# Patient Record
Sex: Female | Born: 1980 | Race: White | Hispanic: No | Marital: Single | Smoking: Current every day smoker
Health system: Southern US, Community
[De-identification: ages and names within clinical notes are randomized; demographics above are authoritative.]

## PROBLEM LIST (undated history)

## (undated) DIAGNOSIS — J45909 Unspecified asthma, uncomplicated: Secondary | ICD-10-CM

## (undated) HISTORY — PX: TONSILLECTOMY: SUR1361

## (undated) HISTORY — PX: TUBAL LIGATION: SHX77

## (undated) HISTORY — DX: Unspecified asthma, uncomplicated: J45.909

---

## 2004-09-08 ENCOUNTER — Inpatient Hospital Stay: Payer: Self-pay

## 2005-12-07 ENCOUNTER — Observation Stay: Payer: Self-pay

## 2006-01-04 ENCOUNTER — Inpatient Hospital Stay: Payer: Self-pay | Admitting: Obstetrics & Gynecology

## 2007-05-11 ENCOUNTER — Emergency Department: Payer: Self-pay | Admitting: Emergency Medicine

## 2013-12-02 ENCOUNTER — Emergency Department: Payer: Self-pay | Admitting: Emergency Medicine

## 2015-09-20 ENCOUNTER — Encounter: Payer: Self-pay | Admitting: Emergency Medicine

## 2015-09-20 ENCOUNTER — Emergency Department
Admission: EM | Admit: 2015-09-20 | Discharge: 2015-09-20 | Disposition: A | Discharge status: Home / Self Care | Payer: Self-pay | Attending: Emergency Medicine | Admitting: Emergency Medicine

## 2015-09-20 DIAGNOSIS — Y9289 Other specified places as the place of occurrence of the external cause: Secondary | ICD-10-CM | POA: Insufficient documentation

## 2015-09-20 DIAGNOSIS — Y998 Other external cause status: Secondary | ICD-10-CM | POA: Insufficient documentation

## 2015-09-20 DIAGNOSIS — S91312A Laceration without foreign body, left foot, initial encounter: Secondary | ICD-10-CM | POA: Insufficient documentation

## 2015-09-20 DIAGNOSIS — Z720 Tobacco use: Secondary | ICD-10-CM | POA: Insufficient documentation

## 2015-09-20 DIAGNOSIS — Z880 Allergy status to penicillin: Secondary | ICD-10-CM | POA: Insufficient documentation

## 2015-09-20 DIAGNOSIS — Y9389 Activity, other specified: Secondary | ICD-10-CM | POA: Insufficient documentation

## 2015-09-20 DIAGNOSIS — W25XXXA Contact with sharp glass, initial encounter: Secondary | ICD-10-CM | POA: Insufficient documentation

## 2015-09-20 MED ORDER — BACITRACIN ZINC 500 UNIT/GM EX OINT
TOPICAL_OINTMENT | CUTANEOUS | Status: AC
  Administered 2015-09-20: 1 via TOPICAL
  Filled 2015-09-20: qty 0.9

## 2015-09-20 MED ORDER — LIDOCAINE-EPINEPHRINE (PF) 2 %-1:200000 IJ SOLN
20.0000 mL | Freq: Once | INTRAMUSCULAR | Status: AC
  Administered 2015-09-20: 20 mL
  Filled 2015-09-20: qty 20

## 2015-09-20 MED ORDER — BACITRACIN ZINC 500 UNIT/GM EX OINT
TOPICAL_OINTMENT | Freq: Two times a day (BID) | CUTANEOUS | Status: DC
  Administered 2015-09-20: 1 via TOPICAL

## 2015-09-20 MED ORDER — LIDOCAINE-EPINEPHRINE (PF) 1 %-1:200000 IJ SOLN
INTRAMUSCULAR | Status: AC
  Filled 2015-09-20: qty 30

## 2015-09-20 NOTE — ED Notes (Signed)
Pt to ed with c/o laceration to bottom of left foot.  Laceration approx 3 inches, mild bleeding at this time, bandage applied. Pt tolerated well.

## 2015-09-20 NOTE — ED Provider Notes (Signed)
[facility] Emergency Department Provider Note  ____________________________________________  Time seen: Approximately 2:51 PM  I have reviewed the triage vital signs and the nursing notes.   HISTORY  Chief Complaint Laceration    HPI Kelly Steele is a 34 y.o. female who presents emergency department complaining of laceration to the bottom of her left foot. She states that she stepped on some broken glass approximately 10 hours prior to arrival. She states that he was controlled. She is not bleeding there was anything embedded in the wound. She reports a sharp stinging sensation over laceration. No numbness or tingling.   History reviewed. No pertinent past medical history.  There are no active problems to display for this patient.   History reviewed. No pertinent past surgical history.  No current outpatient prescriptions on file.  Allergies Penicillins  History reviewed. No pertinent family history.  Social History Social History  Substance Use Topics  . Smoking status: Current Every Day Smoker  . Smokeless tobacco: None  . Alcohol Use: Yes    Review of Systems Constitutional: No fever/chills Eyes: No visual changes. ENT: No sore throat. Cardiovascular: Denies chest pain. Respiratory: Denies shortness of breath. Gastrointestinal: No abdominal pain.  No nausea, no vomiting.  No diarrhea.  No constipation. Genitourinary: Negative for dysuria. Musculoskeletal: Negative for back pain. Skin: Negative for rash. Endorses laceration to the bottom of her left foot. Neurological: Negative for headaches, focal weakness or numbness.  10-point ROS otherwise negative.  ____________________________________________   PHYSICAL EXAM:  VITAL SIGNS: ED Triage Vitals  Enc Vitals Group     BP 09/20/15 1426 132/92 mmHg     Pulse Rate 09/20/15 1426 86     Resp 09/20/15 1426 20     Temp 09/20/15 1426 98.2 F (36.8 C)     Temp Source 09/20/15 1426  Oral     SpO2 09/20/15 1426 100 %     Weight 09/20/15 1426 250 lb (113.399 kg)     Height 09/20/15 1426 5\' 6"  (1.676 m)     Head Cir --      Peak Flow --      Pain Score 09/20/15 1427 8     Pain Loc --      Pain Edu? --      Excl. in GC? --     Constitutional: Alert and oriented. Well appearing and in no acute distress. Eyes: Conjunctivae are normal. PERRL. EOMI. Head: Atraumatic. Nose: No congestion/rhinnorhea. Mouth/Throat: Mucous membranes are moist.  Oropharynx non-erythematous. Neck: No stridor.   Cardiovascular: Normal rate, regular rhythm. Grossly normal heart sounds.  Good peripheral circulation. Respiratory: Normal respiratory effort.  No retractions. Lungs CTAB. Gastrointestinal: Soft and nontender. No distention. No abdominal bruits. No CVA tenderness. Musculoskeletal: No lower extremity tenderness nor edema.  No joint effusions. Neurologic:  Normal speech and language. No gross focal neurologic deficits are appreciated. No gait instability. Skin:  Skin is warm, dry. No rash noted. Laceration noted to the distal plantar region of left foot. Laceration is approximately 8 cm in length. It runs from lateral aspect of the ball of her foot through the proximal first digit. Bleeding is controlled. No visible foreign body. Full range of motion of toe. No visible tendon or muscle damage. Psychiatric: Mood and affect are normal. Speech and behavior are normal.  ____________________________________________   LABS (all labs ordered are listed, but only abnormal results are displayed)  Labs Reviewed - No data to display ____________________________________________  EKG   ____________________________________________  RADIOLOGY   ____________________________________________   PROCEDURES  Procedure(s) performed: yes, laceration repair, see procedure note(s).   LACERATION REPAIR Performed by: Racheal Patches Authorized by: Delorise Royals Cuthriell Consent: Verbal  consent obtained. Risks and benefits: risks, benefits and alternatives were discussed Consent given by: patient Patient identity confirmed: provided demographic data Prepped and Draped in normal sterile fashion Wound explored  Laceration Location: The plantar left foot  Laceration Length: 8 cm  No Foreign Bodies seen or palpated  Anesthesia: local infiltration  Local anesthetic: lidocaine 1 % with epinephrine  Anesthetic total: 15 ml  Irrigation method: syringe Amount of cleaning: standard  Skin closure: 4-0 Ethilon suture   Number of sutures: 9  Technique: Simple interrupted   Patient tolerance: Patient tolerated the procedure well with no immediate complications.   Critical Care performed: No  ____________________________________________   INITIAL IMPRESSION / ASSESSMENT AND PLAN / ED COURSE  Pertinent labs & imaging results that were available during my care of the patient were reviewed by me and considered in my medical decision making (see chart for details).  The patient is a 34 year old female who presents the emergency department with a laceration to the bottom of her left foot. Laceration was cleansed using Betadine and water. Area was then infiltrated with local anesthetic and wound was explored. No foreign body was visible. Laceration was closed using simple interrupted sutures. 9 sutures placed. Patient tolerated procedures well. Gave patient is structures to return in 7-10 days for suture removal. Patient verbalizes understanding of same. Patient to return to the emergency department for any signs of infection. ____________________________________________   FINAL CLINICAL IMPRESSION(S) / ED DIAGNOSES  Final diagnoses:  Foot laceration, left, initial encounter      Racheal Patches, PA-C 09/20/15 1635  Darien Ramus, MD 09/20/15 2046

## 2015-09-20 NOTE — Discharge Instructions (Signed)
Laceration Care, Adult °A laceration is a cut that goes through all of the layers of the skin and into the tissue that is right under the skin. Some lacerations heal on their own. Others need to be closed with stitches (sutures), staples, skin adhesive strips, or skin glue. Proper laceration care minimizes the risk of infection and helps the laceration to heal better. °HOW TO CARE FOR YOUR LACERATION °If sutures or staples were used: °· Keep the wound clean and dry. °· If you were given a bandage (dressing), you should change it at least one time per day or as told by your health care provider. You should also change it if it becomes wet or dirty. °· Keep the wound completely dry for the first 24 hours or as told by your health care provider. After that time, you may shower or bathe. However, make sure that the wound is not soaked in water until after the sutures or staples have been removed. °· Clean the wound one time each day or as told by your health care provider: °· Wash the wound with soap and water. °· Rinse the wound with water to remove all soap. °· Pat the wound dry with a clean towel. Do not rub the wound. °· After cleaning the wound, apply a thin layer of antibiotic ointment as told by your health care provider. This will help to prevent infection and keep the dressing from sticking to the wound. °· Have the sutures or staples removed as told by your health care provider. °If skin adhesive strips were used: °· Keep the wound clean and dry. °· If you were given a bandage (dressing), you should change it at least one time per day or as told by your health care provider. You should also change it if it becomes dirty or wet. °· Do not get the skin adhesive strips wet. You may shower or bathe, but be careful to keep the wound dry. °· If the wound gets wet, pat it dry with a clean towel. Do not rub the wound. °· Skin adhesive strips fall off on their own. You may trim the strips as the wound heals. Do not  remove skin adhesive strips that are still stuck to the wound. They will fall off in time. °If skin glue was used: °· Try to keep the wound dry, but you may briefly wet it in the shower or bath. Do not soak the wound in water, such as by swimming. °· After you have showered or bathed, gently pat the wound dry with a clean towel. Do not rub the wound. °· Do not do any activities that will make you sweat heavily until the skin glue has fallen off on its own. °· Do not apply liquid, cream, or ointment medicine to the wound while the skin glue is in place. Using those may loosen the film before the wound has healed. °· If you were given a bandage (dressing), you should change it at least one time per day or as told by your health care provider. You should also change it if it becomes dirty or wet. °· If a dressing is placed over the wound, be careful not to apply tape directly over the skin glue. Doing that may cause the glue to be pulled off before the wound has healed. °· Do not pick at the glue. The skin glue usually remains in place for 5-10 days, then it falls off of the skin. °General Instructions °· Take over-the-counter and prescription   medicines only as told by your health care provider. °· If you were prescribed an antibiotic medicine or ointment, take or apply it as told by your doctor. Do not stop using it even if your condition improves. °· To help prevent scarring, make sure to cover your wound with sunscreen whenever you are outside after stitches are removed, after adhesive strips are removed, or when glue remains in place and the wound is healed. Make sure to wear a sunscreen of at least 30 SPF. °· Do not scratch or pick at the wound. °· Keep all follow-up visits as told by your health care provider. This is important. °· Check your wound every day for signs of infection. Watch for: °· Redness, swelling, or pain. °· Fluid, blood, or pus. °· Raise (elevate) the injured area above the level of your heart  while you are sitting or lying down, if possible. °SEEK MEDICAL CARE IF: °· You received a tetanus shot and you have swelling, severe pain, redness, or bleeding at the injection site. °· You have a fever. °· A wound that was closed breaks open. °· You notice a bad smell coming from your wound or your dressing. °· You notice something coming out of the wound, such as wood or glass. °· Your pain is not controlled with medicine. °· You have increased redness, swelling, or pain at the site of your wound. °· You have fluid, blood, or pus coming from your wound. °· You notice a change in the color of your skin near your wound. °· You need to change the dressing frequently due to fluid, blood, or pus draining from the wound. °· You develop a new rash. °· You develop numbness around the wound. °SEEK IMMEDIATE MEDICAL CARE IF: °· You develop severe swelling around the wound. °· Your pain suddenly increases and is severe. °· You develop painful lumps near the wound or on skin that is anywhere on your body. °· You have a red streak going away from your wound. °· The wound is on your hand or foot and you cannot properly move a finger or toe. °· The wound is on your hand or foot and you notice that your fingers or toes look pale or bluish. °  °This information is not intended to replace advice given to you by your health care provider. Make sure you discuss any questions you have with your health care provider. °  °Document Released: 11/22/2005 Document Revised: 04/08/2015 Document Reviewed: 11/18/2014 °Elsevier Interactive Patient Education ©2016 [employer] ° °Stitches, Staples, or Adhesive Wound Closure °Health care providers use stitches (sutures), staples, and certain glue (skin adhesives) to hold skin together while it heals (wound closure). You may need this treatment after you have surgery or if you cut your skin accidentally. These methods help your skin to heal more quickly and make it less likely that you will have  a scar. A wound may take several months to heal completely. °The type of wound you have determines when your wound gets closed. In most cases, the wound is closed as soon as possible (primary skin closure). Sometimes, closure is delayed so the wound can be cleaned and allowed to heal naturally. This reduces the chance of infection. Delayed closure may be needed if your wound: °· Is caused by a bite. °· Happened more than 6 hours ago. °· Involves loss of skin or the tissues under the skin. °· Has dirt or debris in it that cannot be removed. °· Is infected. °WHAT   ARE THE DIFFERENT KINDS OF WOUND CLOSURES? °There are many options for wound closure. The one that your health care provider uses depends on how deep and how large your wound is. °Adhesive Glue °To use this type of glue to close a wound, your health care provider holds the edges of the wound together and paints the glue on the surface of your skin. You may need more than one layer of glue. Then the wound may be covered with a light bandage (dressing). °This type of skin closure may be used for small wounds that are not deep (superficial). Using glue for wound closure is less painful than other methods. It does not require a medicine that numbs the area (local anesthetic). This method also leaves nothing to be removed. Adhesive glue is often used for children and on facial wounds. °Adhesive glue cannot be used for wounds that are deep, uneven, or bleeding. It is not used inside of a wound.  °Adhesive Strips °These strips are made of sticky (adhesive), porous paper. They are applied across your skin edges like a regular adhesive bandage. You leave them on until they fall off. °Adhesive strips may be used to close very superficial wounds. They may also be used along with sutures to improve the closure of your skin edges.  °Sutures °Sutures are the oldest method of wound closure. Sutures can be made from natural substances, such as silk, or from synthetic  materials, such as nylon and steel. They can be made from a material that your body can break down as your wound heals (absorbable), or they can be made from a material that needs to be removed from your skin (nonabsorbable). They come in many different strengths and sizes. °Your health care provider attaches the sutures to a steel needle on one end. Sutures can be passed through your skin, or through the tissues beneath your skin. Then they are tied and cut. Your skin edges may be closed in one continuous stitch or in separate stitches. °Sutures are strong and can be used for all kinds of wounds. Absorbable sutures may be used to close tissues under the skin. The disadvantage of sutures is that they may cause skin reactions that lead to infection. Nonabsorbable sutures need to be removed. °Staples °When surgical staples are used to close a wound, the edges of your skin on both sides of the wound are brought close together. A staple is placed across the wound, and an instrument secures the edges together. Staples are often used to close surgical cuts (incisions). °Staples are faster to use than sutures, and they cause less skin reaction. Staples need to be removed using a tool that bends the staples away from your skin. °HOW DO I CARE FOR MY WOUND CLOSURE? °· Take medicines only as directed by your health care provider. °· If you were prescribed an antibiotic medicine for your wound, finish it all even if you start to feel better. °· Use ointments or creams only as directed by your health care provider. °· Wash your hands with soap and water before and after touching your wound. °· Do not soak your wound in water. Do not take baths, swim, or use a hot tub until your health care provider approves. °· Ask your health care provider when you can start showering. Cover your wound if directed by your health care provider. °· Do not take out your own sutures or staples. °· Do not pick at your wound. Picking can cause an  infection. °·   Keep all follow-up visits as directed by your health care provider. This is important. °HOW LONG WILL I HAVE MY WOUND CLOSURE? °· Leave adhesive glue on your skin until the glue peels away. °· Leave adhesive strips on your skin until the strips fall off. °· Absorbable sutures will dissolve within several days. °· Nonabsorbable sutures and staples must be removed. The location of the wound will determine how long they stay in. This can range from several days to a couple of weeks. °WHEN SHOULD I SEEK HELP FOR MY WOUND CLOSURE? °Contact your health care provider if: °· You have a fever. °· You have chills. °· You have drainage, redness, swelling, or pain at your wound. °· There is a bad smell coming from your wound. °· The skin edges of your wound start to separate after your sutures have been removed. °· Your wound becomes thick, raised, and darker in color after your sutures come out (scarring). °  °This information is not intended to replace advice given to you by your health care provider. Make sure you discuss any questions you have with your health care provider. °  °Document Released: 08/17/2001 Document Revised: 12/13/2014 Document Reviewed: 05/01/2014 °Elsevier Interactive Patient Education ©2016 [employer] ° °

## 2015-09-20 NOTE — ED Notes (Signed)
NAD noted at time of D/C. Pt denies questions or concerns. Pt ambulatory to the lobby at this time.  

## 2017-07-18 ENCOUNTER — Encounter (HOSPITAL_COMMUNITY): Payer: Self-pay

## 2017-07-18 ENCOUNTER — Emergency Department (HOSPITAL_COMMUNITY)
Admission: EM | Admit: 2017-07-18 | Discharge: 2017-07-18 | Disposition: A | Discharge status: Home / Self Care | Payer: Self-pay | Attending: Emergency Medicine | Admitting: Emergency Medicine

## 2017-07-18 ENCOUNTER — Emergency Department (HOSPITAL_COMMUNITY): Payer: Self-pay

## 2017-07-18 DIAGNOSIS — N10 Acute tubulo-interstitial nephritis: Secondary | ICD-10-CM | POA: Insufficient documentation

## 2017-07-18 DIAGNOSIS — R109 Unspecified abdominal pain: Secondary | ICD-10-CM

## 2017-07-18 DIAGNOSIS — F17210 Nicotine dependence, cigarettes, uncomplicated: Secondary | ICD-10-CM | POA: Insufficient documentation

## 2017-07-18 DIAGNOSIS — N201 Calculus of ureter: Secondary | ICD-10-CM | POA: Insufficient documentation

## 2017-07-18 DIAGNOSIS — R1012 Left upper quadrant pain: Secondary | ICD-10-CM | POA: Insufficient documentation

## 2017-07-18 LAB — CBC WITH DIFFERENTIAL/PLATELET
Basophils Absolute: 0.1 10*3/uL (ref 0.0–0.1)
Basophils Relative: 0 %
EOS ABS: 0.2 10*3/uL (ref 0.0–0.7)
EOS PCT: 1 %
HCT: 41.6 % (ref 36.0–46.0)
Hemoglobin: 13.6 g/dL (ref 12.0–15.0)
LYMPHS ABS: 3.4 10*3/uL (ref 0.7–4.0)
Lymphocytes Relative: 24 %
MCH: 29.4 pg (ref 26.0–34.0)
MCHC: 32.7 g/dL (ref 30.0–36.0)
MCV: 90 fL (ref 78.0–100.0)
Monocytes Absolute: 1 10*3/uL (ref 0.1–1.0)
Monocytes Relative: 7 %
Neutro Abs: 9.6 10*3/uL — ABNORMAL HIGH (ref 1.7–7.7)
Neutrophils Relative %: 68 %
PLATELETS: 272 10*3/uL (ref 150–400)
RBC: 4.62 MIL/uL (ref 3.87–5.11)
RDW: 12.9 % (ref 11.5–15.5)
WBC: 14.2 10*3/uL — AB (ref 4.0–10.5)

## 2017-07-18 LAB — URINALYSIS, ROUTINE W REFLEX MICROSCOPIC
BILIRUBIN URINE: NEGATIVE
Glucose, UA: NEGATIVE mg/dL
Ketones, ur: NEGATIVE mg/dL
Nitrite: NEGATIVE
Protein, ur: NEGATIVE mg/dL
SPECIFIC GRAVITY, URINE: 1.009 (ref 1.005–1.030)
pH: 6 (ref 5.0–8.0)

## 2017-07-18 LAB — COMPREHENSIVE METABOLIC PANEL
ALT: 42 U/L (ref 14–54)
ANION GAP: 8 (ref 5–15)
AST: 20 U/L (ref 15–41)
Albumin: 3.3 g/dL — ABNORMAL LOW (ref 3.5–5.0)
Alkaline Phosphatase: 56 U/L (ref 38–126)
BUN: 10 mg/dL (ref 6–20)
CHLORIDE: 107 mmol/L (ref 101–111)
CO2: 23 mmol/L (ref 22–32)
Calcium: 8.7 mg/dL — ABNORMAL LOW (ref 8.9–10.3)
Creatinine, Ser: 0.97 mg/dL (ref 0.44–1.00)
GFR calc non Af Amer: >60 mL/min (ref >60)
Glucose, Bld: 98 mg/dL (ref 65–99)
Potassium: 3.5 mmol/L (ref 3.5–5.1)
SODIUM: 138 mmol/L (ref 135–145)
Total Bilirubin: 0.7 mg/dL (ref 0.3–1.2)
Total Protein: 6.7 g/dL (ref 6.5–8.1)

## 2017-07-18 LAB — I-STAT BETA HCG BLOOD, ED (MC, WL, AP ONLY)

## 2017-07-18 LAB — LIPASE, BLOOD: Lipase: 22 U/L (ref 11–51)

## 2017-07-18 MED ORDER — IOPAMIDOL (ISOVUE-300) INJECTION 61%
100.0000 mL | Freq: Once | INTRAVENOUS | Status: AC | PRN
  Administered 2017-07-18: 100 mL via INTRAVENOUS

## 2017-07-18 MED ORDER — FENTANYL CITRATE (PF) 100 MCG/2ML IJ SOLN
50.0000 ug | Freq: Once | INTRAMUSCULAR | Status: AC
  Administered 2017-07-18: 50 ug via INTRAVENOUS
  Filled 2017-07-18: qty 2

## 2017-07-18 MED ORDER — KETOROLAC TROMETHAMINE 30 MG/ML IJ SOLN
30.0000 mg | Freq: Once | INTRAMUSCULAR | Status: AC
  Administered 2017-07-18: 30 mg via INTRAVENOUS
  Filled 2017-07-18: qty 1

## 2017-07-18 MED ORDER — IOPAMIDOL (ISOVUE-300) INJECTION 61%
INTRAVENOUS | Status: AC
  Filled 2017-07-18: qty 30

## 2017-07-18 MED ORDER — CIPROFLOXACIN IN D5W 400 MG/200ML IV SOLN
400.0000 mg | Freq: Once | INTRAVENOUS | Status: AC
  Administered 2017-07-18: 400 mg via INTRAVENOUS
  Filled 2017-07-18: qty 200

## 2017-07-18 MED ORDER — NAPROXEN 500 MG PO TABS: 500.0000 mg | ORAL_TABLET | Freq: Two times a day (BID) | ORAL | 0 refills | Status: DC

## 2017-07-18 MED ORDER — SODIUM CHLORIDE 0.9 % IV BOLUS (SEPSIS)
500.0000 mL | Freq: Once | INTRAVENOUS | Status: AC
  Administered 2017-07-18: 500 mL via INTRAVENOUS

## 2017-07-18 MED ORDER — HYDROCODONE-ACETAMINOPHEN 5-325 MG PO TABS: 1.0000 | ORAL_TABLET | Freq: Four times a day (QID) | ORAL | 0 refills | Status: DC | PRN

## 2017-07-18 MED ORDER — ONDANSETRON HCL 4 MG/2ML IJ SOLN
4.0000 mg | Freq: Once | INTRAMUSCULAR | Status: AC
  Administered 2017-07-18: 4 mg via INTRAVENOUS
  Filled 2017-07-18: qty 2

## 2017-07-18 MED ORDER — SODIUM CHLORIDE 0.9 % IV SOLN
INTRAVENOUS | Status: DC
  Administered 2017-07-18: 17:00:00 via INTRAVENOUS

## 2017-07-18 MED ORDER — ONDANSETRON HCL 4 MG/2ML IJ SOLN
4.0000 mg | Freq: Once | INTRAMUSCULAR | Status: AC
  Administered 2017-07-18: 4 mg via INTRAVENOUS

## 2017-07-18 MED ORDER — CIPROFLOXACIN HCL 500 MG PO TABS
500.0000 mg | ORAL_TABLET | Freq: Two times a day (BID) | ORAL | 0 refills | Status: DC
Start: 2017-07-18 — End: 2018-09-19

## 2017-07-18 MED ORDER — ONDANSETRON HCL 4 MG/2ML IJ SOLN
INTRAMUSCULAR | Status: AC
  Filled 2017-07-18: qty 2

## 2017-07-18 NOTE — ED Provider Notes (Signed)
[facility]-EMERGENCY DEPT Provider Note   CSN: [id number] Arrival date & time: 07/18/17  1543     History   Chief Complaint Chief Complaint  Patient presents with  . Abdominal Pain    HPI Kelly Steele is a 36 y.o. female.  Patient with complaint of left upper quadrant left flank pain radiating to left lower back started yesterday. Associated with nausea but no vomiting no diarrhea. No fever. Did have chills. No dysuria. Patient is currently on her period and is having vaginal bleeding. Pain as severe at times it is been constant since it started.      History reviewed. No pertinent past medical history.  There are no active problems to display for this patient.   Past Surgical History:  Procedure Laterality Date  . TONSILLECTOMY    . TUBAL LIGATION      OB History    Gravida Para Term Preterm AB Living   3       1 2    SAB TAB Ectopic Multiple Live Births                   Home Medications    Prior to Admission medications   Medication Sig Start Date End Date Taking? Authorizing Provider  ciprofloxacin (CIPRO) 500 MG tablet Take 1 tablet (500 mg total) by mouth 2 (two) times daily. 07/18/17   Vanetta Mulders, MD  HYDROcodone-acetaminophen (NORCO/VICODIN) 5-325 MG tablet Take 1-2 tablets by mouth every 6 (six) hours as needed. 07/18/17   Vanetta Mulders, MD  naproxen (NAPROSYN) 500 MG tablet Take 1 tablet (500 mg total) by mouth 2 (two) times daily. 07/18/17   Vanetta Mulders, MD    Family History No family history on file.  Social History Social History  Substance Use Topics  . Smoking status: Current Every Day Smoker    Packs/day: 1.00    Types: Cigarettes  . Smokeless tobacco: Never Used  . Alcohol use Yes     Comment: occ     Allergies   Penicillins   Review of Systems Review of Systems  Constitutional: Positive for chills. Negative for fever.  HENT: Negative for congestion.   Eyes: Negative for visual disturbance.  Respiratory: Negative for  shortness of breath.   Cardiovascular: Negative for chest pain.  Gastrointestinal: Positive for abdominal pain and nausea. Negative for vomiting.  Genitourinary: Positive for flank pain and vaginal bleeding. Negative for dysuria.  Musculoskeletal: Positive for back pain.  Skin: Negative for rash.  Neurological: Negative for headaches.  Hematological: Does not bruise/bleed easily.  Psychiatric/Behavioral: Negative for confusion.     Physical Exam Updated Vital Signs BP (!) 135/97 (BP Location: Right Arm)   Pulse 70   Temp 98.3 F (36.8 C) (Oral)   Resp 17   Ht 1.676 m (5\' 6" )   Wt 136.1 kg (300 lb)   LMP 07/15/2017   SpO2 98%   BMI 48.42 kg/m   Physical Exam  Constitutional: She is oriented to person, place, and time. She appears well-developed and well-nourished. No distress.  HENT:  Head: Normocephalic and atraumatic.  Mouth/Throat: Oropharynx is clear and moist.  Eyes: Pupils are equal, round, and reactive to light. Conjunctivae and EOM are normal.  Neck: Normal range of motion. Neck supple.  Cardiovascular: Normal rate and regular rhythm.   Pulmonary/Chest: Effort normal and breath sounds normal. No respiratory distress.  Abdominal: Soft. Bowel sounds are normal. There is no tenderness.  Musculoskeletal: Normal range of motion.  Neurological: She is alert  and oriented to person, place, and time. No cranial nerve deficit or sensory deficit. She exhibits normal muscle tone. Coordination normal.  Skin: Skin is warm. No rash noted.  Nursing note and vitals reviewed.    ED Treatments / Results  Labs (all labs ordered are listed, but only abnormal results are displayed) Labs Reviewed  URINALYSIS, ROUTINE W REFLEX MICROSCOPIC - Abnormal; Notable for the following:       Result Value   Hgb urine dipstick SMALL (*)    Leukocytes, UA MODERATE (*)    Bacteria, UA RARE (*)    Squamous Epithelial / LPF 0-5 (*)    All other components within normal limits  CBC WITH  DIFFERENTIAL/PLATELET - Abnormal; Notable for the following:    WBC 14.2 (*)    Neutro Abs 9.6 (*)    All other components within normal limits  COMPREHENSIVE METABOLIC PANEL - Abnormal; Notable for the following:    Calcium 8.7 (*)    Albumin 3.3 (*)    All other components within normal limits  URINE CULTURE  LIPASE, BLOOD  I-STAT BETA HCG BLOOD, ED ([facility], [facility], [facility] ONLY)    EKG  EKG Interpretation None       Radiology Ct Abdomen Pelvis W Contrast  Result Date: 07/18/2017 CLINICAL DATA:  Left-sided abdominal pain radiating to the left flank EXAM: CT ABDOMEN AND PELVIS WITH CONTRAST TECHNIQUE: Multidetector CT imaging of the abdomen and pelvis was performed using the standard protocol following bolus administration of intravenous contrast. CONTRAST:  100 mL Isovue-300 intravenous COMPARISON:  None. FINDINGS: Lower chest: Lung bases demonstrate no acute consolidation or pleural effusion. The heart size is within normal limits. Hepatobiliary: No focal liver abnormality is seen. No gallstones, gallbladder wall thickening, or biliary dilatation. Pancreas: Unremarkable. No pancreatic ductal dilatation or surrounding inflammatory changes. Spleen: Normal in size without focal abnormality. Adrenals/Urinary Tract: Adrenal glands are within normal limits. Mild left perinephric fat stranding with edema around the left renal pelvis. M mildly enlarged left renal pelvis with urothelial enhancement of the renal pelvis an ureter. Questionable punctate, sand like density within the mid left ureter. Slightly thick-walled appearance of the urinary bladder. Stomach/Bowel: Stomach is within normal limits. Appendix appears normal. No evidence of bowel wall thickening, distention, or inflammatory changes. Vascular/Lymphatic: No significant vascular findings are present. No enlarged abdominal or pelvic lymph nodes. Reproductive: Uterus and bilateral adnexa are unremarkable. Other: Negative for free air or free fluid  Musculoskeletal: Degenerative changes. No acute or suspicious bone lesion. IMPRESSION: 1. Left perinephric edema. Slightly enlarged left renal pelvis with urothelial enhancement. Questionable punctate stones in the mid left ureter. Normal right kidney. 2. Slightly thick-walled appearance of the bladder, could be due to underdistention or cystitis. Electronically Signed   By: Jasmine PangKim  Fujinaga M.D.   On: 07/18/2017 18:36    Procedures Procedures (including critical care time)  Medications Ordered in ED Medications  0.9 %  sodium chloride infusion ( Intravenous New Bag/Given 07/18/17 1704)  ciprofloxacin (CIPRO) IVPB 400 mg (400 mg Intravenous New Bag/Given 07/18/17 1911)  ondansetron (ZOFRAN) injection 4 mg (4 mg Intravenous Given 07/18/17 1703)  sodium chloride 0.9 % bolus 500 mL (500 mLs Intravenous New Bag/Given 07/18/17 1703)  fentaNYL (SUBLIMAZE) injection 50 mcg (50 mcg Intravenous Given 07/18/17 1703)  iopamidol (ISOVUE-300) 61 % injection 100 mL (100 mLs Intravenous Contrast Given 07/18/17 1823)  ondansetron (ZOFRAN) injection 4 mg (4 mg Intravenous Given 07/18/17 1837)  ketorolac (TORADOL) 30 MG/ML injection 30 mg (30 mg Intravenous Given  07/18/17 1919)     Initial Impression / Assessment and Plan / ED Course  I have reviewed the triage vital signs and the nursing notes.  Pertinent labs & imaging results that were available during my care of the patient were reviewed by me and considered in my medical decision making (see chart for details).     Workup for the left-sided flank pain shows concerns for urinary tract infection perhaps pyelonephritis. Also although not confirmatory CT scan highly suggestive of a left ureteral stone. Will treat for both. Patient received IV Cipro here will be continued on oral Cipro. Patient has a penicillin allergy. Patient will also be treated with Naprosyn and hydrocodone. Referral to urology provided. Patient will return if not improving in 2 days. Return for  any new or worse symptoms.  Final Clinical Impressions(s) / ED Diagnoses   Final diagnoses:  Flank pain  Acute pyelonephritis  Left ureteral stone    New Prescriptions New Prescriptions   CIPROFLOXACIN (CIPRO) 500 MG TABLET    Take 1 tablet (500 mg total) by mouth 2 (two) times daily.   HYDROCODONE-ACETAMINOPHEN (NORCO/VICODIN) 5-325 MG TABLET    Take 1-2 tablets by mouth every 6 (six) hours as needed.   NAPROXEN (NAPROSYN) 500 MG TABLET    Take 1 tablet (500 mg total) by mouth 2 (two) times daily.     Vanetta Mulders, MD 07/18/17 2000

## 2017-07-18 NOTE — ED Triage Notes (Signed)
Reports of left side abdominal pain that radiates into left flank. States "I have a knot in my stomach that moves around". Has been going on for years.

## 2017-07-18 NOTE — Discharge Instructions (Signed)
Workup here today consistent with the possible left-sided kidney stone as well as possible bladder infection or kidney infection. They can plan to follow-up with urology. Take pain medicine as needed. Take the Naprosyn on a regular basis. Take the antibiotic as directed. Return for any new or worse symptoms. Urology can probably follow you up in the [place] area as well. Would expect some improvement over the next 2 days. If not return. Work note provided.

## 2017-07-18 NOTE — ED Notes (Signed)
Pt vomiting at this time 

## 2017-07-18 NOTE — ED Notes (Signed)
Pt gone to CT 

## 2017-07-21 LAB — URINE CULTURE

## 2017-07-22 ENCOUNTER — Telehealth: Payer: Self-pay

## 2017-07-22 NOTE — Telephone Encounter (Signed)
Post ED Visit - Positive Culture Follow-up  Culture report reviewed by antimicrobial stewardship pharmacist:  []  Enzo Bi, Pharm.D. []  Celedonio Miyamoto, Pharm.D., BCPS AQ-ID []  Garvin Fila, Pharm.D., BCPS []  Georgina Pillion, Pharm.D., BCPS []  [place], [address].D., BCPS, AAHIVP []  Estella Husk, Pharm.D., BCPS, AAHIVP []  Lysle Pearl, PharmD, BCPS []  Casilda Carls, PharmD, BCPS []  Pollyann Samples, PharmD, BCPS Erin Deja Pharm D Positive urine culture Treated with Ciprofloxacin, organism sensitive to the same and no further patient follow-up is required at this time.  Jerry Caras 07/22/2017, 10:25 AM

## 2018-01-27 ENCOUNTER — Encounter (HOSPITAL_COMMUNITY): Payer: Self-pay | Admitting: Emergency Medicine

## 2018-01-27 ENCOUNTER — Other Ambulatory Visit: Payer: Self-pay

## 2018-01-27 ENCOUNTER — Emergency Department (HOSPITAL_COMMUNITY): Payer: Self-pay

## 2018-01-27 ENCOUNTER — Emergency Department (HOSPITAL_COMMUNITY)
Admission: EM | Admit: 2018-01-27 | Discharge: 2018-01-27 | Disposition: A | Discharge status: Home / Self Care | Payer: Self-pay | Attending: Emergency Medicine | Admitting: Emergency Medicine

## 2018-01-27 DIAGNOSIS — R110 Nausea: Secondary | ICD-10-CM | POA: Insufficient documentation

## 2018-01-27 DIAGNOSIS — R1012 Left upper quadrant pain: Secondary | ICD-10-CM | POA: Insufficient documentation

## 2018-01-27 DIAGNOSIS — R197 Diarrhea, unspecified: Secondary | ICD-10-CM | POA: Insufficient documentation

## 2018-01-27 DIAGNOSIS — F17210 Nicotine dependence, cigarettes, uncomplicated: Secondary | ICD-10-CM | POA: Insufficient documentation

## 2018-01-27 LAB — COMPREHENSIVE METABOLIC PANEL
ALBUMIN: 3.5 g/dL (ref 3.5–5.0)
ALK PHOS: 60 U/L (ref 38–126)
ALT: 45 U/L (ref 14–54)
AST: 26 U/L (ref 15–41)
Anion gap: 10 (ref 5–15)
BUN: 10 mg/dL (ref 6–20)
CALCIUM: 8.6 mg/dL — AB (ref 8.9–10.3)
CO2: 21 mmol/L — AB (ref 22–32)
CREATININE: 0.94 mg/dL (ref 0.44–1.00)
Chloride: 108 mmol/L (ref 101–111)
GFR calc Af Amer: >60 mL/min (ref >60)
GFR calc non Af Amer: >60 mL/min (ref >60)
GLUCOSE: 95 mg/dL (ref 65–99)
Potassium: 3.7 mmol/L (ref 3.5–5.1)
Sodium: 139 mmol/L (ref 135–145)
Total Bilirubin: 0.8 mg/dL (ref 0.3–1.2)
Total Protein: 6.9 g/dL (ref 6.5–8.1)

## 2018-01-27 LAB — LIPASE, BLOOD: Lipase: 23 U/L (ref 11–51)

## 2018-01-27 LAB — CBC
HCT: 43.1 % (ref 36.0–46.0)
HEMOGLOBIN: 13.5 g/dL (ref 12.0–15.0)
MCH: 28.7 pg (ref 26.0–34.0)
MCHC: 31.3 g/dL (ref 30.0–36.0)
MCV: 91.7 fL (ref 78.0–100.0)
PLATELETS: 302 10*3/uL (ref 150–400)
RBC: 4.7 MIL/uL (ref 3.87–5.11)
RDW: 13.4 % (ref 11.5–15.5)
WBC: 8.7 10*3/uL (ref 4.0–10.5)

## 2018-01-27 LAB — URINALYSIS, ROUTINE W REFLEX MICROSCOPIC
BILIRUBIN URINE: NEGATIVE
Glucose, UA: NEGATIVE mg/dL
HGB URINE DIPSTICK: NEGATIVE
KETONES UR: NEGATIVE mg/dL
Leukocytes, UA: NEGATIVE
Nitrite: NEGATIVE
Protein, ur: NEGATIVE mg/dL
SPECIFIC GRAVITY, URINE: 1.012 (ref 1.005–1.030)
pH: 6 (ref 5.0–8.0)

## 2018-01-27 LAB — PREGNANCY, URINE: Preg Test, Ur: NEGATIVE

## 2018-01-27 MED ORDER — KETOROLAC TROMETHAMINE 30 MG/ML IJ SOLN
30.0000 mg | Freq: Once | INTRAMUSCULAR | Status: AC
  Administered 2018-01-27: 30 mg via INTRAVENOUS
  Filled 2018-01-27: qty 1

## 2018-01-27 MED ORDER — ONDANSETRON 4 MG PO TBDP: 4.0000 mg | ORAL_TABLET | Freq: Three times a day (TID) | ORAL | 0 refills | Status: DC | PRN

## 2018-01-27 MED ORDER — FAMOTIDINE 20 MG PO TABS: 20.0000 mg | ORAL_TABLET | Freq: Two times a day (BID) | ORAL | 0 refills | Status: DC

## 2018-01-27 MED ORDER — ONDANSETRON HCL 4 MG/2ML IJ SOLN
4.0000 mg | INTRAMUSCULAR | Status: DC | PRN
  Administered 2018-01-27: 4 mg via INTRAVENOUS
  Filled 2018-01-27: qty 2

## 2018-01-27 MED ORDER — FAMOTIDINE IN NACL 20-0.9 MG/50ML-% IV SOLN
20.0000 mg | Freq: Once | INTRAVENOUS | Status: AC
Start: 2018-01-27 — End: 2018-01-27
  Administered 2018-01-27: 20 mg via INTRAVENOUS
  Filled 2018-01-27: qty 50

## 2018-01-27 MED ORDER — GI COCKTAIL ~~LOC~~
30.0000 mL | Freq: Once | ORAL | Status: AC
  Administered 2018-01-27: 30 mL via ORAL
  Filled 2018-01-27: qty 30

## 2018-01-27 NOTE — ED Triage Notes (Addendum)
Pt c/o left side abd pain. Has been seen here for this in past and dx with gall stones per pt. States constant pain to LUQ. Denies gu sx. lnbm this am. Nad. Denies v/d. Some nausea

## 2018-01-27 NOTE — ED Notes (Signed)
Patient given ginger-ale, tolerating well

## 2018-01-27 NOTE — ED Provider Notes (Signed)
[facility] EMERGENCY DEPARTMENT Provider Note   CSN: [id number] Arrival date & time: 01/27/18  1007     History   Chief Complaint Chief Complaint  Patient presents with  . Abdominal Pain    HPI Kelly Steele is a 37 y.o. female.  HPI  Pt was seen at 1220.  Per pt, c/o gradual onset and persistence of constant LUQ abd "pain" for the past 4 days. Has been associated with nausea and multiple intermittent episodes of diarrhea.  Describes the abd pain as "hurting."  Denies vomiting, no fevers, no back pain, no rash, no CP/SOB, no black or blood in stools, no dysuria/hematuria.      History reviewed. No pertinent past medical history.  There are no active problems to display for this patient.   Past Surgical History:  Procedure Laterality Date  . TONSILLECTOMY    . TUBAL LIGATION      OB History    Gravida Para Term Preterm AB Living   3       1 2    SAB TAB Ectopic Multiple Live Births                   Home Medications    Prior to Admission medications   Medication Sig Start Date End Date Taking? Authorizing Provider  ciprofloxacin (CIPRO) 500 MG tablet Take 1 tablet (500 mg total) by mouth 2 (two) times daily. Patient not taking: Reported on 01/27/2018 07/18/17   Vanetta Mulders, MD  HYDROcodone-acetaminophen (NORCO/VICODIN) 5-325 MG tablet Take 1-2 tablets by mouth every 6 (six) hours as needed. Patient not taking: Reported on 01/27/2018 07/18/17   Vanetta Mulders, MD  naproxen (NAPROSYN) 500 MG tablet Take 1 tablet (500 mg total) by mouth 2 (two) times daily. Patient not taking: Reported on 01/27/2018 07/18/17   Vanetta Mulders, MD    Family History History reviewed. No pertinent family history.  Social History Social History   Tobacco Use  . Smoking status: Current Every Day Smoker    Packs/day: 1.00    Types: Cigarettes  . Smokeless tobacco: Never Used  Substance Use Topics  . Alcohol use: Yes    Comment: occ  . Drug use: Yes    Types: Marijuana      Allergies   Penicillins   Review of Systems Review of Systems ROS: Statement: All systems negative except as marked or noted in the HPI; Constitutional: Negative for fever and chills. ; ; Eyes: Negative for eye pain, redness and discharge. ; ; ENMT: Negative for ear pain, hoarseness, nasal congestion, sinus pressure and sore throat. ; ; Cardiovascular: Negative for chest pain, palpitations, diaphoresis, dyspnea and peripheral edema. ; ; Respiratory: Negative for cough, wheezing and stridor. ; ; Gastrointestinal: +nausea, diarrhea, abd pain. Negative for vomiting, blood in stool, hematemesis, jaundice and rectal bleeding. . ; ; Genitourinary: Negative for dysuria, flank pain and hematuria. ; ; GYN:  No pelvic pain, no vaginal bleeding, no vaginal discharge, no vulvar pain. ;; Musculoskeletal: Negative for back pain and neck pain. Negative for swelling and trauma.; ; Skin: Negative for pruritus, rash, abrasions, blisters, bruising and skin lesion.; ; Neuro: Negative for headache, lightheadedness and neck stiffness. Negative for weakness, altered level of consciousness, altered mental status, extremity weakness, paresthesias, involuntary movement, seizure and syncope.     Physical Exam Updated Vital Signs BP (!) 142/87 (BP Location: Right Arm)   Pulse 60   Temp 98.2 F (36.8 C) (Oral)   Resp 16   LMP 01/16/2018  SpO2 100%   Physical Exam 1225: Physical examination:  Nursing notes reviewed; Vital signs and O2 SAT reviewed;  Constitutional: Well developed, Well nourished, Well hydrated, In no acute distress; Head:  Normocephalic, atraumatic; Eyes: EOMI, PERRL, No scleral icterus; ENMT: Mouth and pharynx normal, Mucous membranes moist; Neck: Supple, Full range of motion, No lymphadenopathy; Cardiovascular: Regular rate and rhythm, No gallop; Respiratory: Breath sounds clear & equal bilaterally, No wheezes.  Speaking full sentences with ease, Normal respiratory effort/excursion; Chest:  Nontender, Movement normal; Abdomen: Soft, +LUQ tenderness to palp. No rebound or guarding. Nondistended, Normal bowel sounds; Genitourinary: No CVA tenderness; Spine:  No midline CS, TS, LS tenderness.;; Extremities: Pulses normal, No tenderness, No edema, No calf edema or asymmetry.; Neuro: AA&Ox3, Major CN grossly intact.  Speech clear. No gross focal motor or sensory deficits in extremities.; Skin: Color normal, Warm, Dry.   ED Treatments / Results  Labs (all labs ordered are listed, but only abnormal results are displayed)   EKG  EKG Interpretation None       Radiology   Procedures Procedures (including critical care time)  Medications Ordered in ED Medications  famotidine (PEPCID) IVPB 20 mg premix (20 mg Intravenous New Bag/Given 01/27/18 1240)  ondansetron (ZOFRAN) injection 4 mg (4 mg Intravenous Given 01/27/18 1240)  ketorolac (TORADOL) 30 MG/ML injection 30 mg (30 mg Intravenous Given 01/27/18 1240)     Initial Impression / Assessment and Plan / ED Course  I have reviewed the triage vital signs and the nursing notes.  Pertinent labs & imaging results that were available during my care of the patient were reviewed by me and considered in my medical decision making (see chart for details).  MDM Reviewed: previous chart, nursing note and vitals Reviewed previous: labs and CT scan Interpretation: labs and CT scan   Results for orders placed or performed during the hospital encounter of 01/27/18  Lipase, blood  Result Value Ref Range   Lipase 23 11 - 51 U/L  Comprehensive metabolic panel  Result Value Ref Range   Sodium 139 135 - 145 mmol/L   Potassium 3.7 3.5 - 5.1 mmol/L   Chloride 108 101 - 111 mmol/L   CO2 21 (L) 22 - 32 mmol/L   Glucose, Bld 95 65 - 99 mg/dL   BUN 10 6 - 20 mg/dL   Creatinine, Ser 1.610.94 0.44 - 1.00 mg/dL   Calcium 8.6 (L) 8.9 - 10.3 mg/dL   Total Protein 6.9 6.5 - 8.1 g/dL   Albumin 3.5 3.5 - 5.0 g/dL   AST 26 15 - 41 U/L   ALT 45 14 -  54 U/L   Alkaline Phosphatase 60 38 - 126 U/L   Total Bilirubin 0.8 0.3 - 1.2 mg/dL   GFR calc non Af Amer >60 >60 mL/min   GFR calc Af Amer >60 >60 mL/min   Anion gap 10 5 - 15  CBC  Result Value Ref Range   WBC 8.7 4.0 - 10.5 K/uL   RBC 4.70 3.87 - 5.11 MIL/uL   Hemoglobin 13.5 12.0 - 15.0 g/dL   HCT 09.643.1 04.536.0 - 40.946.0 %   MCV 91.7 78.0 - 100.0 fL   MCH 28.7 26.0 - 34.0 pg   MCHC 31.3 30.0 - 36.0 g/dL   RDW 81.113.4 91.411.5 - 78.215.5 %   Platelets 302 150 - 400 K/uL  Urinalysis, Routine w reflex microscopic  Result Value Ref Range   Color, Urine YELLOW YELLOW   APPearance CLEAR CLEAR   Specific  Gravity, Urine 1.012 1.005 - 1.030   pH 6.0 5.0 - 8.0   Glucose, UA NEGATIVE NEGATIVE mg/dL   Hgb urine dipstick NEGATIVE NEGATIVE   Bilirubin Urine NEGATIVE NEGATIVE   Ketones, ur NEGATIVE NEGATIVE mg/dL   Protein, ur NEGATIVE NEGATIVE mg/dL   Nitrite NEGATIVE NEGATIVE   Leukocytes, UA NEGATIVE NEGATIVE  Pregnancy, urine  Result Value Ref Range   Preg Test, Ur NEGATIVE NEGATIVE   Ct Renal Stone Study Result Date: 01/27/2018 CLINICAL DATA:  37 y/o  F; 3 days of left flank pain. EXAM: CT ABDOMEN AND PELVIS WITHOUT CONTRAST TECHNIQUE: Multidetector CT imaging of the abdomen and pelvis was performed following the standard protocol without IV contrast. COMPARISON:  07/18/2017 CT abdomen and pelvis FINDINGS: Lower chest: No acute abnormality. Hepatobiliary: No focal liver abnormality is seen. No gallstones, gallbladder wall thickening, or biliary dilatation. Pancreas: Unremarkable. No pancreatic ductal dilatation or surrounding inflammatory changes. Spleen: Normal in size without focal abnormality. Adrenals/Urinary Tract: Adrenal glands are unremarkable. Stable subcentimeter cyst in left kidney upper pole. Kidneys are otherwise normal, without renal calculi, focal lesion, or hydronephrosis. Bladder is unremarkable. Stomach/Bowel: Stomach is within normal limits. Appendix appears normal. No evidence of  bowel wall thickening, distention, or inflammatory changes. Vascular/Lymphatic: No significant vascular findings are present. No enlarged abdominal or pelvic lymph nodes. Reproductive: Uterus and bilateral adnexa are unremarkable. Other: No abdominal wall hernia or abnormality. No abdominopelvic ascites. Musculoskeletal: No fracture is seen. Mild lower lumbar spine disc and facet degenerative changes. Transitional lumbosacral anatomy. IMPRESSION: No acute process identified. No urinary stone disease or hydronephrosis. Stable unremarkable CT of abdomen and pelvis. Electronically Signed   By: Mitzi Hansen M.D.   On: 01/27/2018 13:42    1430:  Pt has tol PO well while in the ED without N/V.  No stooling while in the ED.  Abd benign, VSS. Feels better and wants to go home now. Tx symptomatically, f/u GI MD. Dx and testing d/w pt.  Questions answered.  Verb understanding, agreeable to d/c home with outpt f/u.    Final Clinical Impressions(s) / ED Diagnoses   Final diagnoses:  None    ED Discharge Orders    None       Samuel Jester, DO 01/29/18 2118

## 2018-01-27 NOTE — Discharge Instructions (Signed)
Eat a bland diet, avoiding greasy, fatty, fried foods, as well as spicy and acidic foods or beverages.  Avoid eating within 2 to 3 hours before going to bed or laying down.  Also avoid teas, colas, coffee, chocolate, pepermint and spearment.  May also take over the counter maalox/mylanta, as directed on packaging, as needed for discomfort.  Take the prescriptions as directed.  Call your regular medical doctor today to schedule a follow up appointment in the next 4 days. Call the GI doctor to schedule a follow up appointment within the next week.  Return to the Emergency Department immediately if worsening.

## 2018-02-08 ENCOUNTER — Emergency Department (HOSPITAL_COMMUNITY)
Admission: EM | Admit: 2018-02-08 | Discharge: 2018-02-08 | Disposition: A | Discharge status: Home / Self Care | Payer: Self-pay | Attending: Emergency Medicine | Admitting: Emergency Medicine

## 2018-02-08 ENCOUNTER — Other Ambulatory Visit: Payer: Self-pay

## 2018-02-08 ENCOUNTER — Encounter (HOSPITAL_COMMUNITY): Payer: Self-pay | Admitting: Emergency Medicine

## 2018-02-08 DIAGNOSIS — K047 Periapical abscess without sinus: Secondary | ICD-10-CM | POA: Insufficient documentation

## 2018-02-08 DIAGNOSIS — F17210 Nicotine dependence, cigarettes, uncomplicated: Secondary | ICD-10-CM | POA: Insufficient documentation

## 2018-02-08 DIAGNOSIS — Z79899 Other long term (current) drug therapy: Secondary | ICD-10-CM | POA: Insufficient documentation

## 2018-02-08 MED ORDER — TRAMADOL HCL 50 MG PO TABS: 50.0000 mg | ORAL_TABLET | Freq: Four times a day (QID) | ORAL | 0 refills | Status: DC | PRN

## 2018-02-08 MED ORDER — CLINDAMYCIN HCL 150 MG PO CAPS
450.0000 mg | ORAL_CAPSULE | Freq: Once | ORAL | Status: AC
  Administered 2018-02-08: 450 mg via ORAL
  Filled 2018-02-08: qty 3

## 2018-02-08 MED ORDER — CLINDAMYCIN HCL 150 MG PO CAPS: 450.0000 mg | ORAL_CAPSULE | Freq: Three times a day (TID) | ORAL | 0 refills | Status: DC

## 2018-02-08 NOTE — Discharge Instructions (Signed)
Complete your entire course of antibiotics as prescribed.  You  may use the tramadol for pain relief but do not drive within 4 hours of taking as this will make you drowsy. I also recommend continuing with ibuprofen 800 mg every 8 hours (4 tablets).  Once the antibiotic is effective, ibuprofen is more likely to help with pain. Avoid applying heat or ice to this abscess area which can worsen your symptoms.  You may use warm salt water swish and spit treatment or half peroxide and water swish and spit after meals to keep this area clean as discussed.  Call the dentist listed above for further management of your symptoms.

## 2018-02-08 NOTE — ED Triage Notes (Signed)
Pt reports dental pain to RT upper jaw with RT otalgia. States pain has been going on for 4 months, but intensified in the past 2-3 days.

## 2018-02-08 NOTE — ED Provider Notes (Signed)
[facility] EMERGENCY DEPARTMENT Provider Note   CSN: [id number] Arrival date & time: 02/08/18  1028     History   Chief Complaint Chief Complaint  Patient presents with  . Dental Pain    HPI Kelly Steele is a 37 y.o. female .presenting with a several day history of dental pain and gingival swelling although states she has had chronic but worsening pain in the involved tooth for some months with a known deep cavity in her right upper 2nd molar tooth.  She describes worsening cold sensitivity in the tooth.     There has been no fevers, chills, nausea or vomiting, also no complaint of difficulty swallowing, although chewing makes pain worse.  The patient has tried ibuprofen without relief of symptoms.  She has plans to establish care with Dr Lamont DowdyWheless, but is concerned about needing abx now given the gum swelling.  .  The history is provided by the patient.    History reviewed. No pertinent past medical history.  There are no active problems to display for this patient.   Past Surgical History:  Procedure Laterality Date  . TONSILLECTOMY    . TUBAL LIGATION      OB History    Gravida Para Term Preterm AB Living   3       1 2    SAB TAB Ectopic Multiple Live Births                   Home Medications    Prior to Admission medications   Medication Sig Start Date End Date Taking? Authorizing Provider  ciprofloxacin (CIPRO) 500 MG tablet Take 1 tablet (500 mg total) by mouth 2 (two) times daily. Patient not taking: Reported on 01/27/2018 07/18/17   Vanetta MuldersZackowski, Scott, MD  clindamycin (CLEOCIN) 150 MG capsule Take 3 capsules (450 mg total) by mouth 3 (three) times daily. 02/08/18   Burgess AmorIdol, Kimber Esterly, PA-C  famotidine (PEPCID) 20 MG tablet Take 1 tablet (20 mg total) by mouth 2 (two) times daily. 01/27/18   Samuel JesterMcManus, Kathleen, DO  HYDROcodone-acetaminophen (NORCO/VICODIN) 5-325 MG tablet Take 1-2 tablets by mouth every 6 (six) hours as needed. Patient not taking: Reported on 01/27/2018  07/18/17   Vanetta MuldersZackowski, Scott, MD  naproxen (NAPROSYN) 500 MG tablet Take 1 tablet (500 mg total) by mouth 2 (two) times daily. Patient not taking: Reported on 01/27/2018 07/18/17   Vanetta MuldersZackowski, Scott, MD  ondansetron (ZOFRAN ODT) 4 MG disintegrating tablet Take 1 tablet (4 mg total) by mouth every 8 (eight) hours as needed for nausea or vomiting. 01/27/18   Samuel JesterMcManus, Kathleen, DO  traMADol (ULTRAM) 50 MG tablet Take 1 tablet (50 mg total) by mouth every 6 (six) hours as needed. 02/08/18   Burgess AmorIdol, Bettejane Leavens, PA-C    Family History No family history on file.  Social History Social History   Tobacco Use  . Smoking status: Current Every Day Smoker    Packs/day: 1.00    Types: Cigarettes  . Smokeless tobacco: Never Used  Substance Use Topics  . Alcohol use: Yes    Comment: occ  . Drug use: Yes    Types: Marijuana    Comment: last use 3/5     Allergies   Penicillins   Review of Systems Review of Systems  Constitutional: Negative for fever.  HENT: Positive for dental problem. Negative for facial swelling and sore throat.   Respiratory: Negative for shortness of breath.   Musculoskeletal: Negative for neck pain and neck stiffness.  Physical Exam Updated Vital Signs BP (!) 148/97 (BP Location: Right Arm)   Pulse (!) 102   Temp (!) 97.5 F (36.4 C) (Oral)   Resp 20   Ht 5\' 6"  (1.676 m)   Wt 136.1 kg (300 lb)   LMP 01/16/2018 Comment: NEG UPREG  SpO2 100%   BMI 48.42 kg/m   Physical Exam  Constitutional: She is oriented to person, place, and time. She appears well-developed and well-nourished. No distress.  HENT:  Head: Normocephalic and atraumatic.  Right Ear: Tympanic membrane and external ear normal.  Left Ear: Tympanic membrane and external ear normal.  Mouth/Throat: Oropharynx is clear and moist and mucous membranes are normal. No oral lesions. No trismus in the jaw. Dental abscesses present.    Eyes: Conjunctivae are normal.  Neck: Normal range of motion. Neck supple.    Cardiovascular: Normal rate and normal heart sounds.  Pulmonary/Chest: Effort normal.  Musculoskeletal: Normal range of motion.  Lymphadenopathy:    She has no cervical adenopathy.  Neurological: She is alert and oriented to person, place, and time.  Skin: Skin is warm and dry. No erythema.  Psychiatric: She has a normal mood and affect.     ED Treatments / Results  Labs (all labs ordered are listed, but only abnormal results are displayed) Labs Reviewed - No data to display  EKG  EKG Interpretation None       Radiology No results found.  Procedures Procedures (including critical care time)  Medications Ordered in ED Medications  clindamycin (CLEOCIN) capsule 450 mg (450 mg Oral Given 02/08/18 1210)     Initial Impression / Assessment and Plan / ED Course  I have reviewed the triage vital signs and the nursing notes.  Pertinent labs & imaging results that were available during my care of the patient were reviewed by me and considered in my medical decision making (see chart for details).     No trismus, fevers or other constitutional sx. Pt with simple dental abscess. Started on clindamycin given pcn allergy. Plan f/u with dentistry as planned. Return precautions outlined.  Final Clinical Impressions(s) / ED Diagnoses   Final diagnoses:  Dental infection    ED Discharge Orders        Ordered    clindamycin (CLEOCIN) 150 MG capsule  3 times daily     02/08/18 1205    traMADol (ULTRAM) 50 MG tablet  Every 6 hours PRN     02/08/18 1205       Burgess Amor, PA-C 02/08/18 2204    Samuel Jester, DO 02/09/18 2207

## 2018-04-26 ENCOUNTER — Encounter (HOSPITAL_COMMUNITY): Payer: Self-pay | Admitting: *Deleted

## 2018-04-26 ENCOUNTER — Emergency Department (HOSPITAL_COMMUNITY)
Admission: EM | Admit: 2018-04-26 | Discharge: 2018-04-27 | Disposition: A | Discharge status: Home / Self Care | Payer: Self-pay | Attending: Emergency Medicine | Admitting: Emergency Medicine

## 2018-04-26 ENCOUNTER — Other Ambulatory Visit: Payer: Self-pay

## 2018-04-26 DIAGNOSIS — J329 Chronic sinusitis, unspecified: Secondary | ICD-10-CM

## 2018-04-26 DIAGNOSIS — I10 Essential (primary) hypertension: Secondary | ICD-10-CM | POA: Insufficient documentation

## 2018-04-26 DIAGNOSIS — K0889 Other specified disorders of teeth and supporting structures: Secondary | ICD-10-CM | POA: Insufficient documentation

## 2018-04-26 DIAGNOSIS — J019 Acute sinusitis, unspecified: Secondary | ICD-10-CM | POA: Insufficient documentation

## 2018-04-26 DIAGNOSIS — Z79899 Other long term (current) drug therapy: Secondary | ICD-10-CM | POA: Insufficient documentation

## 2018-04-26 DIAGNOSIS — F17210 Nicotine dependence, cigarettes, uncomplicated: Secondary | ICD-10-CM | POA: Insufficient documentation

## 2018-04-26 NOTE — ED Triage Notes (Addendum)
Pt reports pain to a tooth in the left upper part of her mouth. Pt reports taking her mothers  clindamycin. (8 tablets)

## 2018-04-27 MED ORDER — PROMETHAZINE HCL 12.5 MG PO TABS
12.5000 mg | ORAL_TABLET | Freq: Once | ORAL | Status: AC
  Administered 2018-04-27: 12.5 mg via ORAL
  Filled 2018-04-27: qty 1

## 2018-04-27 MED ORDER — TRAMADOL HCL 50 MG PO TABS
100.0000 mg | ORAL_TABLET | Freq: Once | ORAL | Status: AC
Start: 2018-04-27 — End: 2018-04-27
  Administered 2018-04-27: 100 mg via ORAL
  Filled 2018-04-27: qty 2

## 2018-04-27 MED ORDER — CLINDAMYCIN HCL 150 MG PO CAPS
300.0000 mg | ORAL_CAPSULE | Freq: Once | ORAL | Status: AC
  Administered 2018-04-27: 300 mg via ORAL
  Filled 2018-04-27: qty 2

## 2018-04-27 MED ORDER — IBUPROFEN 600 MG PO TABS: 600.0000 mg | ORAL_TABLET | Freq: Four times a day (QID) | ORAL | 0 refills | Status: AC

## 2018-04-27 MED ORDER — IBUPROFEN 800 MG PO TABS
800.0000 mg | ORAL_TABLET | Freq: Once | ORAL | Status: AC
  Administered 2018-04-27: 800 mg via ORAL
  Filled 2018-04-27: qty 1

## 2018-04-27 MED ORDER — CLINDAMYCIN HCL 300 MG PO CAPS: 300.0000 mg | ORAL_CAPSULE | Freq: Three times a day (TID) | ORAL | 0 refills | Status: DC

## 2018-04-27 MED ORDER — TRAMADOL HCL 50 MG PO TABS: 50.0000 mg | ORAL_TABLET | Freq: Four times a day (QID) | ORAL | 0 refills | Status: DC | PRN

## 2018-04-27 NOTE — Discharge Instructions (Signed)
Your blood pressure is elevated at 165/105.  Please have this checked soon.  It is important that you see your dentist as soon as possible concerning the dental pain and facial pain.  Please use clindamycin 3 times daily.  Please use ibuprofen with breakfast, lunch, dinner, and at bedtime.  Use Ultram every 6 hours if needed for more severe pain.

## 2018-04-27 NOTE — ED Provider Notes (Signed)
[facility] EMERGENCY DEPARTMENT Provider Note   CSN: [id number] Arrival date & time: 04/26/18  2244     History   Chief Complaint Chief Complaint  Patient presents with  . Dental Pain    HPI Kelly Steele is a 37 y.o. female.  Patient is a 37 year old female who presents to the emergency department with a complaint of left face pain and left tooth pain.  The patient states that she was saying a few weeks ago with rather severe infection of her teeth.  She was seen by dentist.  She was able to have one x-ray done but could not have the Panorex done.  There was noted that she would need to have the wisdom teeth removed.  She was unable to have the remainder of the study done to complete the diagnosis.  She says that she does not have adequate insurance and she cannot afford out of pocket payment for now.  She says the pain is been getting progressively worse recently.  No high fever reported.     History reviewed. No pertinent past medical history.  There are no active problems to display for this patient.   Past Surgical History:  Procedure Laterality Date  . TONSILLECTOMY    . TUBAL LIGATION       OB History    Gravida  3   Para      Term      Preterm      AB  1   Living  2     SAB      TAB      Ectopic      Multiple      Live Births               Home Medications    Prior to Admission medications   Medication Sig Start Date End Date Taking? Authorizing Provider  ciprofloxacin (CIPRO) 500 MG tablet Take 1 tablet (500 mg total) by mouth 2 (two) times daily. Patient not taking: Reported on 01/27/2018 07/18/17   Vanetta Mulders, MD  clindamycin (CLEOCIN) 150 MG capsule Take 3 capsules (450 mg total) by mouth 3 (three) times daily. 02/08/18   Burgess Amor, PA-C  famotidine (PEPCID) 20 MG tablet Take 1 tablet (20 mg total) by mouth 2 (two) times daily. 01/27/18   Samuel Jester, DO  HYDROcodone-acetaminophen (NORCO/VICODIN) 5-325 MG tablet Take 1-2  tablets by mouth every 6 (six) hours as needed. Patient not taking: Reported on 01/27/2018 07/18/17   Vanetta Mulders, MD  naproxen (NAPROSYN) 500 MG tablet Take 1 tablet (500 mg total) by mouth 2 (two) times daily. Patient not taking: Reported on 01/27/2018 07/18/17   Vanetta Mulders, MD  ondansetron (ZOFRAN ODT) 4 MG disintegrating tablet Take 1 tablet (4 mg total) by mouth every 8 (eight) hours as needed for nausea or vomiting. 01/27/18   Samuel Jester, DO  traMADol (ULTRAM) 50 MG tablet Take 1 tablet (50 mg total) by mouth every 6 (six) hours as needed. 02/08/18   Burgess Amor, PA-C    Family History No family history on file.  Social History Social History   Tobacco Use  . Smoking status: Current Every Day Smoker    Packs/day: 1.00    Types: Cigarettes  . Smokeless tobacco: Never Used  Substance Use Topics  . Alcohol use: Yes    Comment: occ  . Drug use: Yes    Types: Marijuana    Comment: last use 3/5     Allergies  Penicillins   Review of Systems Review of Systems  Constitutional: Negative for activity change.       All ROS Neg except as noted in HPI  HENT: Positive for congestion, dental problem and facial swelling. Negative for nosebleeds.   Eyes: Negative for photophobia and discharge.  Respiratory: Negative for cough, shortness of breath and wheezing.   Cardiovascular: Negative for chest pain and palpitations.  Gastrointestinal: Negative for abdominal pain and blood in stool.  Genitourinary: Negative for dysuria, frequency and hematuria.  Musculoskeletal: Negative for arthralgias, back pain and neck pain.  Skin: Negative.   Neurological: Negative for dizziness, seizures and speech difficulty.  Psychiatric/Behavioral: Negative for confusion and hallucinations.     Physical Exam Updated Vital Signs BP (!) 165/105   Pulse 66   Temp 98 F (36.7 C)   Resp 20   Ht  (1.676 m)   Wt 136.1 kg (300 lb)   LMP 04/16/2018   SpO2 100%   BMI 48.42 kg/m    Physical Exam  Constitutional: She is oriented to person, place, and time. She appears well-developed and well-nourished.  Non-toxic appearance.  HENT:  Head: Normocephalic.  Right Ear: Tympanic membrane and external ear normal.  Left Ear: Tympanic membrane and external ear normal.  Nasal congestion present.  There is pain to percussion of the posterior molar of the upper left.  The airway is patent.  There is no swelling under the tongue.  Patient mobilizes secretions without problem.  Eyes: Pupils are equal, round, and reactive to light. EOM and lids are normal.  Neck: Normal range of motion. Neck supple. Carotid bruit is not present.  Cardiovascular: Normal rate, regular rhythm, normal heart sounds, intact distal pulses and normal pulses.  Pulmonary/Chest: Breath sounds normal. No respiratory distress.  Abdominal: Soft. Bowel sounds are normal. There is no tenderness. There is no guarding.  Musculoskeletal: Normal range of motion.  Lymphadenopathy:       Head (right side): No submandibular adenopathy present.       Head (left side): No submandibular adenopathy present.    She has no cervical adenopathy.  Neurological: She is alert and oriented to person, place, and time. She has normal strength. No cranial nerve deficit or sensory deficit.  Skin: Skin is warm and dry.  Psychiatric: She has a normal mood and affect. Her speech is normal.  Nursing note and vitals reviewed.    ED Treatments / Results  Labs (all labs ordered are listed, but only abnormal results are displayed) Labs Reviewed - No data to display  EKG None  Radiology No results found.  Procedures Procedures (including critical care time)  Medications Ordered in ED Medications  traMADol (ULTRAM) tablet 100 mg (100 mg Oral Given 04/27/18 0034)  promethazine (PHENERGAN) tablet 12.5 mg (12.5 mg Oral Given 04/27/18 0034)  clindamycin (CLEOCIN) capsule 300 mg (300 mg Oral Given 04/27/18 0033)  ibuprofen  (ADVIL,MOTRIN) tablet 800 mg (800 mg Oral Given 04/27/18 0034)     Initial Impression / Assessment and Plan / ED Course  I have reviewed the triage vital signs and the nursing notes.  Pertinent labs & imaging results that were available during my care of the patient were reviewed by me and considered in my medical decision making (see chart for details).       Final Clinical Impressions(s) / ED Diagnoses MDM  Blood pressure is elevated at 165/105.  Otherwise vital signs within normal limits.  Patient was asked to have this rechecked soon.  The patient was recently seen for problem with infection involving her teeth.  She is seeing a dentist, but was only able to have part of the work-up done due to insurance and financial issues.  The pain is come back and causing of facial pain.  No evidence for Ludewig's angina.  Patient will be treated with clindamycin, ibuprofen, and 10 tablets of Ultram.  Patient encouraged to see the dentist as soon as possible.  Patient acknowledges understanding of these instructions.   Final diagnoses:  Pain, dental  Sinusitis, unspecified chronicity, unspecified location  Essential hypertension    ED Discharge Orders        Ordered    clindamycin (CLEOCIN) 300 MG capsule  3 times daily     04/27/18 0121    traMADol (ULTRAM) 50 MG tablet  Every 6 hours PRN     04/27/18 0121    ibuprofen (ADVIL,MOTRIN) 600 MG tablet  4 times daily     04/27/18 0121       Ivery Quale, PA-C 04/27/18 0122    Geoffery Lyons, MD 04/27/18 [phone]

## 2018-09-19 ENCOUNTER — Other Ambulatory Visit: Payer: Self-pay

## 2018-09-19 ENCOUNTER — Emergency Department (HOSPITAL_COMMUNITY)
Admission: EM | Admit: 2018-09-19 | Discharge: 2018-09-19 | Disposition: A | Discharge status: Home / Self Care | Payer: Self-pay | Attending: Emergency Medicine | Admitting: Emergency Medicine

## 2018-09-19 ENCOUNTER — Emergency Department (HOSPITAL_COMMUNITY): Payer: Self-pay

## 2018-09-19 ENCOUNTER — Encounter (HOSPITAL_COMMUNITY): Payer: Self-pay

## 2018-09-19 DIAGNOSIS — Z79899 Other long term (current) drug therapy: Secondary | ICD-10-CM | POA: Insufficient documentation

## 2018-09-19 DIAGNOSIS — Y939 Activity, unspecified: Secondary | ICD-10-CM | POA: Insufficient documentation

## 2018-09-19 DIAGNOSIS — F17210 Nicotine dependence, cigarettes, uncomplicated: Secondary | ICD-10-CM | POA: Insufficient documentation

## 2018-09-19 DIAGNOSIS — Y999 Unspecified external cause status: Secondary | ICD-10-CM | POA: Insufficient documentation

## 2018-09-19 DIAGNOSIS — Y929 Unspecified place or not applicable: Secondary | ICD-10-CM | POA: Insufficient documentation

## 2018-09-19 DIAGNOSIS — S42355A Nondisplaced comminuted fracture of shaft of humerus, left arm, initial encounter for closed fracture: Secondary | ICD-10-CM | POA: Insufficient documentation

## 2018-09-19 DIAGNOSIS — W0110XA Fall on same level from slipping, tripping and stumbling with subsequent striking against unspecified object, initial encounter: Secondary | ICD-10-CM | POA: Insufficient documentation

## 2018-09-19 MED ORDER — OXYCODONE-ACETAMINOPHEN 5-325 MG PO TABS: 1.0000 | ORAL_TABLET | ORAL | 0 refills | Status: DC | PRN

## 2018-09-19 MED ORDER — HYDROMORPHONE HCL 1 MG/ML IJ SOLN
1.0000 mg | Freq: Once | INTRAMUSCULAR | Status: AC
  Administered 2018-09-19: 1 mg via INTRAMUSCULAR
  Filled 2018-09-19: qty 1

## 2018-09-19 NOTE — ED Triage Notes (Signed)
Pt reports drinking and dancing last night, slipped on hardwood floor. Landed on left arm reports pain in entire arm

## 2018-09-19 NOTE — Discharge Instructions (Addendum)
You have fractured the humerus bone.  Splint, immobilizing device, ice, pain medicine.  He can also take ibuprofen.  Follow-up with orthopedic surgeon.  Phone number given.

## 2018-09-19 NOTE — ED Notes (Signed)
Patient transported to X-ray 

## 2018-09-19 NOTE — ED Notes (Signed)
Patient back from  X-ray 

## 2018-09-20 ENCOUNTER — Encounter: Payer: Self-pay | Admitting: Orthopedic Surgery

## 2018-09-20 ENCOUNTER — Ambulatory Visit (INDEPENDENT_AMBULATORY_CARE_PROVIDER_SITE_OTHER): Payer: Self-pay

## 2018-09-20 ENCOUNTER — Ambulatory Visit: Payer: Self-pay | Admitting: Orthopedic Surgery

## 2018-09-20 VITALS — BP 143/94 | HR 103 | Ht 66.0 in | Wt 274.0 lb

## 2018-09-20 DIAGNOSIS — Z6841 Body Mass Index (BMI) 40.0 and over, adult: Secondary | ICD-10-CM

## 2018-09-20 DIAGNOSIS — S42295A Other nondisplaced fracture of upper end of left humerus, initial encounter for closed fracture: Secondary | ICD-10-CM

## 2018-09-20 DIAGNOSIS — F17210 Nicotine dependence, cigarettes, uncomplicated: Secondary | ICD-10-CM

## 2018-09-20 MED ORDER — OXYCODONE-ACETAMINOPHEN 5-325 MG PO TABS: 1.0000 | ORAL_TABLET | ORAL | 0 refills | Status: AC | PRN

## 2018-09-20 NOTE — Progress Notes (Signed)
NEW PATIENT OFFICE VISI  Chief Complaint  Patient presents with  . 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  She complains of left shoulder pain which included her left humerus.  She went to the emergency room where x-rays showed a comminuted fracture she was placed on a coaptation splint and started on Percocet 5 mg.  She does not have any associated symptoms of numbness she does have some swelling  Location left arm Quality dull ache Pain scale 10 Duration one day Timing constant Context stable   Review of Systems  Neurological: Negative for tingling.  All other systems reviewed and are negative.    No past medical history on file.  No hypertension or diabetes  Past Surgical History:  Procedure Laterality Date  . TONSILLECTOMY    . TUBAL LIGATION      Family History  Problem Relation Age of Onset  . Autoimmune disease Mother   . Rheumatologic disease Mother   . Lupus Father   . Heart block Father   . Heart attack Father   . Leukemia Sister    Social History   Tobacco Use  . Smoking status: Current Every Day Smoker    Packs/day: 1.00    Types: Cigarettes  . Smokeless tobacco: Never Used  Substance Use Topics  . Alcohol use: Yes    Comment: occ  . Drug use: Yes    Types: Marijuana    Comment: last use 3/5    Allergies  Allergen Reactions  . Penicillins Other (See Comments)    Has patient had a PCN reaction causing immediate rash, facial/tongue/throat swelling, SOB or lightheadedness with hypotension: Unknown Has patient had a PCN reaction causing severe rash involving mucus membranes or skin necrosis: Unknown Has patient had a PCN reaction that required hospitalization: Unknown Has patient had a PCN reaction occurring within the last 10 years: No If all of the above answers are "NO", then may proceed with Cephalosporin  use.     No outpatient medications have been marked as taking for the 09/20/18 encounter (Office Visit) with Vickki Hearing, MD.    BP (!) 143/94   Pulse (!) 103   Ht 5\' 6"  (1.676 m)   Wt 274 lb (124.3 kg)   LMP 09/14/2018   BMI 44.22 kg/m   Physical Exam General appearance obesity grooming hygiene normal appears somnolent Oriented to person and place and time Mood pleasant affect flat Gait normal Ortho Exam   Left upper extremity shows mild deformity apex lateral tenderness proximal humerus crepitance shoulder and elbow range of motion limited by pain wrist and hand normal including radial nerve function stability of the shoulder deferred elbow deferred x-rays show no subluxation or dislocation wrist extension strength grip strength normal skin no evidence of ecchymosis pulse and temperature normal lymph nodes axilla supraclavicular region negative sensation normal in the hand   MEDICAL DECISION SECTION  Xrays were done at [facility]   My independent reading of xrays:  Comminuted fracture midshaft and extending proximally into the humerus mild valgus angulation  Encounter Diagnoses  Name Primary?  . Other closed nondisplaced fracture of proximal end of left humerus, initial encounter Yes  . Body mass index 40.0-44.9, adult (HCC)   . Morbid obesity (HCC)   . Cigarette nicotine dependence without complication     PLAN: (Rx., injectx, surgery, frx, mri/ct) Coaptation splint  had to be reapplied, repeat x-ray shows apex lateral less than 20 degrees fracture comminuted  Coaptation splint x-ray 1 week, Sarmiento brace 2 weeks  The patient meets the AMA guidelines for Morbid (severe) obesity with a BMI > 40.0 and I have recommended weight loss.  Nicotine addiction dependence discussed including as it relates to the fracture  No orders of the defined types were placed in this encounter.   Fuller Canada, MD  09/20/2018 11:05 AM

## 2018-09-20 NOTE — Patient Instructions (Signed)
OOW 6 WEEKS  

## 2018-09-22 NOTE — ED Provider Notes (Signed)
[facility] EMERGENCY DEPARTMENT Provider Note   CSN: [id number] Arrival date & time: 09/19/18  0865     History   Chief Complaint Chief Complaint  Patient presents with  . Arm Pain    HPI Kelly Steele is a 37 y.o. female.  Status post accidental trip and fall the previous evening striking the left humerus.  No head or neck trauma.  No neuro deficits.  No other obvious injury.  Severity of pain is moderate to severe.  Palpation and position make pain worse.     History reviewed. No pertinent past medical history.  There are no active problems to display for this patient.   Past Surgical History:  Procedure Laterality Date  . TONSILLECTOMY    . TUBAL LIGATION       OB History    Gravida  3   Para      Term      Preterm      AB  1   Living  2     SAB      TAB      Ectopic      Multiple      Live Births               Home Medications    Prior to Admission medications   Medication Sig Start Date End Date Taking? Authorizing Provider  HYDROcodone-acetaminophen (NORCO/VICODIN) 5-325 MG tablet Take 1-2 tablets by mouth every 6 (six) hours as needed. Patient not taking: Reported on 01/27/2018 07/18/17   Vanetta Mulders, MD  ibuprofen (ADVIL,MOTRIN) 600 MG tablet Take 1 tablet (600 mg total) by mouth 4 (four) times daily. 04/27/18   Ivery Quale, PA-C  oxyCODONE-acetaminophen (PERCOCET) 5-325 MG tablet Take 1 tablet by mouth every 4 (four) hours as needed. 09/19/18   Donnetta Hutching, MD  oxyCODONE-acetaminophen (PERCOCET/ROXICET) 5-325 MG tablet Take 1 tablet by mouth every 4 (four) hours as needed for up to 5 days for severe pain. 09/20/18 09/25/18  Vickki Hearing, MD    Family History Family History  Problem Relation Age of Onset  . Autoimmune disease Mother   . Rheumatologic disease Mother   . Lupus Father   . Heart block Father   . Heart attack Father   . Leukemia Sister     Social History Social History   Tobacco Use  . Smoking  status: Current Every Day Smoker    Packs/day: 1.00    Types: Cigarettes  . Smokeless tobacco: Never Used  Substance Use Topics  . Alcohol use: Yes    Comment: occ  . Drug use: Yes    Types: Marijuana    Comment: last use 3/5     Allergies   Penicillins   Review of Systems Review of Systems  All other systems reviewed and are negative.    Physical Exam Updated Vital Signs BP 130/70 (BP Location: Right Arm)   Pulse 60   Temp 97.9 F (36.6 C) (Oral)   Resp 18   Wt (!) 136.1 kg   LMP 09/14/2018   SpO2 99%   BMI 48.43 kg/m   Physical Exam  Constitutional: She is oriented to person, place, and time. She appears well-developed and well-nourished.  HENT:  Head: Normocephalic and atraumatic.  Eyes: Conjunctivae are normal.  Neck: Neck supple.  Cardiovascular: Normal rate and regular rhythm.  Pulmonary/Chest: Effort normal and breath sounds normal.  Abdominal: Soft. Bowel sounds are normal.  Musculoskeletal:  Left upper extremity: Tender throughout the shaft  of the humerus.  Neurovascular intact.  Neurological: She is alert and oriented to person, place, and time.  Skin: Skin is warm and dry.  Psychiatric: She has a normal mood and affect. Her behavior is normal.  Nursing note and vitals reviewed.    ED Treatments / Results  Labs (all labs ordered are listed, but only abnormal results are displayed) Labs Reviewed - No data to display  EKG None  Radiology Dg Humerus Left  Result Date: 09/20/2018 X-ray report 3 views left humerus Less than 20 degrees of angulation are seen on all views Comminuted fracture mid to proximal humerus X-ray again in a week   Procedures Procedures (including critical care time)  Medications Ordered in ED Medications  HYDROmorphone (DILAUDID) injection 1 mg (1 mg Intramuscular Given 09/19/18 0958)  HYDROmorphone (DILAUDID) injection 1 mg (1 mg Intramuscular Given 09/19/18 1305)     Initial Impression / Assessment and Plan /  ED Course  I have reviewed the triage vital signs and the nursing notes.  Pertinent labs & imaging results that were available during my care of the patient were reviewed by me and considered in my medical decision making (see chart for details).     Patient presents with left humeral pain after fall last night.  X-rays reveal comminuted displaced fracture.  Discussed findings with orthopedic surgeon on-call.  He recommended a plaster splint and sling.  Will follow-up in office.  Final Clinical Impressions(s) / ED Diagnoses   Final diagnoses:  Closed nondisplaced comminuted fracture of shaft of left humerus, initial encounter    ED Discharge Orders         Ordered    oxyCODONE-acetaminophen (PERCOCET) 5-325 MG tablet  Every 4 hours PRN     09/19/18 1331           Donnetta Hutching, MD 09/22/18 1121

## 2018-09-26 DIAGNOSIS — S42302A Unspecified fracture of shaft of humerus, left arm, initial encounter for closed fracture: Secondary | ICD-10-CM | POA: Insufficient documentation

## 2018-09-27 ENCOUNTER — Encounter: Payer: Self-pay | Admitting: Orthopedic Surgery

## 2018-09-27 ENCOUNTER — Ambulatory Visit (INDEPENDENT_AMBULATORY_CARE_PROVIDER_SITE_OTHER): Payer: Self-pay | Admitting: Orthopedic Surgery

## 2018-09-27 ENCOUNTER — Ambulatory Visit (INDEPENDENT_AMBULATORY_CARE_PROVIDER_SITE_OTHER): Payer: Self-pay

## 2018-09-27 VITALS — BP 143/93 | HR 73 | Ht 66.0 in | Wt 274.0 lb

## 2018-09-27 DIAGNOSIS — S42345D Nondisplaced spiral fracture of shaft of humerus, left arm, subsequent encounter for fracture with routine healing: Secondary | ICD-10-CM

## 2018-09-27 MED ORDER — OXYCODONE-ACETAMINOPHEN 5-325 MG PO TABS: 1.0000 | ORAL_TABLET | ORAL | 0 refills | Status: DC | PRN

## 2018-09-27 NOTE — Patient Instructions (Signed)
What You Need to Know About Prescription Opioid Pain Medicine        Please be advised. You are on a medication which is classified as an "opiod". The CDC the [facility]  has recently advised all providers to advise patient's that these medications have certain risks which include but are not limited to:    drug intolerance  drug addiction  respiratory depression   respiratory failure  Death  Please keep these medications locked away. If you feel that you are becoming addicted to these medicines or you are having difficulties with these medications please alert your provider.   As your provider I will attempt to wean you off of these medications when you're severe acute pain has been taking care of. However, if we cannot wean you off of this medication you will be sent to a pain management center where they can better manage chronic pain   Opioids are powerful medicines that are used to treat moderate to severe pain. Opioids should be taken with the supervision of a trained health care provider. They should be taken for the shortest period of time as possible. This is because opioids can be addictive and the longer you take opioids, the greater your risk of addiction (opioid use disorder). What do opioids do? Opioids help reduce or eliminate pain. When used for short periods of time, they can help you:  Sleep better.  Do better in physical or occupational therapy.  Feel better in the first few days after an injury.  Recover from surgery. What kind of problems can opioids cause? Opioids can cause side effects, such as:  Constipation.  Nausea.  Vomiting.  Drowsiness.  Confusion.  Opioid use disorder.  Breathing difficulties (respiratory depression). Using opioid pain medicines for longer than 3 days increases your risk of these side effects. Taking opioid pain medicine for a long period of time can affect your ability to do daily tasks. It also  puts you at risk for:  Car accidents.  Heart attack.  Overdose, which can sometimes lead to death. What can increase my risk for developing problems while taking opioids? You may be at an especially high risk for problems while taking opioids if you:  Are over the age of 65.  Are pregnant.  Have kidney or liver disease.  Have certain mental health conditions, such as depression or anxiety.  Have a history of substance use disorder.  Have had an opioid overdose in the past. How do I stop taking opioids if I have been taking them for a long time? If you have been taking opioid medicine for more than a few weeks, you may need to slowly stop taking them (taper). Tapering your use of opioids can decrease your chances of experiencing withdrawal symptoms, such as:  Abdominal pain and cramping.  Nausea.  Sweating.  Sleepiness.  Restlessness.  Uncontrollable shaking (tremors).  Cravings for the medicine. Do not attempt to taper your use of opioids on your own. Talk with your health care provider about how to do this. Your health care provider may prescribe a step-down schedule based on how much medicine you are taking and how long you have been taking it. What are the benefits of stopping the use of opioids? By switching from opioid pain medicine to non-opioid pain management options, you will decrease your risk of accidents and injuries associated with long-term opioid use. You will also be able to:  Monitor your pain more accurately and know when to   seek medical care if it is not improving.  Decrease risk to others around you. Having opioids in the home increases the risk for accidental or intentional use or overdose by others. How can I treat pain without opioids? Pain can be managed with many types of alternative treatments. Ask your health care provider to refer you to one or more specialists who can help you manage pain through:  Physical or occupational  therapy.  Counseling (cognitive-behavioral therapy).  Good nutrition.  Biofeedback.  Massage.  Meditation.  Non-opioid medicine.  Following a gentle exercise program. Where can I get support? If you have been taking opioids for a long time, you may benefit from receiving support for quitting from a local support group or counselor. Ask your health care provider for a referral to these resources in your area. When should I seek medical care? Seek medical care right away if you are taking opioids and you experience any of the following:  Difficulty breathing.  Breathing that is more shallow or slower than normal.  A very slow heartbeat (pulse).  Severe confusion.  Unconsciousness.  Sleepiness.  Difficulty waking from sleep.  Slurred speech.  Nausea and vomiting.  Cold, clammy skin.  Blue lips or fingernails.  Limpness.  Abnormally small pupils. If you think that you or someone else may have taken too much of an opioid medicine, get medical help right away. Do not wait to see if the symptoms go away on their own. Call your local emergency services (911 in the [place] or call the hotline of the National Poison Control Center (800-222-1222 in the [place]  Where can I get more information? To learn more about opioid medicines, visit the Centers for Disease Control and Prevention web site Opioid Basics at https://www.cdc.gov/drugoverdos/opioids/index.html. Summary  Opioid medicines can help you manage moderate-to-severe pain for a short period of time.  Taking opioid pain medicine for a long period of time puts you at risk for unintentional accidents, injury, and even death.  If you think that you or someone else may have taken too much of an opioid, get medical help right away. This information is not intended to replace advice given to you by your health care provider. Make sure you discuss any questions you have with your health care provider. Document Released:  12/19/2015 Document Revised: 07/16/2016 Document Reviewed: 07/04/2015 Elsevier Interactive Patient Education  2017 Elsevier Inc.   

## 2018-09-27 NOTE — Progress Notes (Signed)
Chief Complaint  Patient presents with  . Arm Injury    Left arm humeral fx DOI 09/19/18   xrays show 20 degrees angulation of main fracture fragments   Meds ordered this encounter  Medications  . oxyCODONE-acetaminophen (PERCOCET) 5-325 MG tablet    Sig: Take 1 tablet by mouth every 4 (four) hours as needed.    Dispense:  42 tablet    Refill:  0   xrays 1 week   Radial nerve: intact   Patient placed in fracture cuff

## 2018-10-04 ENCOUNTER — Ambulatory Visit: Payer: Self-pay | Admitting: Orthopedic Surgery

## 2018-10-06 ENCOUNTER — Ambulatory Visit (INDEPENDENT_AMBULATORY_CARE_PROVIDER_SITE_OTHER): Payer: Self-pay | Admitting: Orthopedic Surgery

## 2018-10-06 ENCOUNTER — Encounter: Payer: Self-pay | Admitting: Orthopedic Surgery

## 2018-10-06 ENCOUNTER — Ambulatory Visit (INDEPENDENT_AMBULATORY_CARE_PROVIDER_SITE_OTHER): Payer: Self-pay

## 2018-10-06 VITALS — BP 139/73 | HR 69 | Ht 66.0 in | Wt 275.0 lb

## 2018-10-06 DIAGNOSIS — S42345D Nondisplaced spiral fracture of shaft of humerus, left arm, subsequent encounter for fracture with routine healing: Secondary | ICD-10-CM

## 2018-10-06 MED ORDER — OXYCODONE-ACETAMINOPHEN 5-325 MG PO TABS: 1.0000 | ORAL_TABLET | ORAL | 0 refills | Status: DC | PRN

## 2018-10-06 NOTE — Progress Notes (Signed)
Chief Complaint  Patient presents with  . Arm Injury    Left arm DOI 09/19/18    Encounter Diagnosis  Name Primary?  . Closed nondisplaced spiral fracture of shaft of left humerus with routine healing, subsequent encounter 09/19/18 Yes    POID # [id number] Meds ordered this encounter  Medications  . oxyCODONE-acetaminophen (PERCOCET) 5-325 MG tablet    Sig: Take 1 tablet by mouth every 4 (four) hours as needed.    Dispense:  42 tablet    Refill:  0    The patient is having some trouble adjusting her's fracture cuff had some increased pain no numbness or tingling wrist extension thumb extension finger extension radial nerve function intact  Fracture cuff adjusted  X-ray looks good no visible callus yet but alignment is excellent  Return 4 weeks  Medication refill

## 2018-10-09 ENCOUNTER — Encounter: Payer: Self-pay | Admitting: Orthopedic Surgery

## 2018-10-12 ENCOUNTER — Other Ambulatory Visit: Payer: Self-pay | Admitting: Orthopedic Surgery

## 2018-10-12 DIAGNOSIS — S42345D Nondisplaced spiral fracture of shaft of humerus, left arm, subsequent encounter for fracture with routine healing: Secondary | ICD-10-CM

## 2018-10-12 NOTE — Telephone Encounter (Signed)
Oxycodone-Acetaminophen 5/325 mg  Qty 42 Tablets  Take 1 tablet by mouth every 4 (four) hours as needed.  PATIENT USES [facility] [address]

## 2018-10-13 MED ORDER — OXYCODONE-ACETAMINOPHEN 5-325 MG PO TABS: 1.0000 | ORAL_TABLET | Freq: Four times a day (QID) | ORAL | 0 refills | Status: DC | PRN

## 2018-10-18 ENCOUNTER — Telehealth: Payer: Self-pay | Admitting: Orthopedic Surgery

## 2018-10-18 ENCOUNTER — Encounter: Payer: Self-pay | Admitting: Orthopedic Surgery

## 2018-10-18 ENCOUNTER — Ambulatory Visit (INDEPENDENT_AMBULATORY_CARE_PROVIDER_SITE_OTHER): Payer: Self-pay | Admitting: Orthopedic Surgery

## 2018-10-18 VITALS — BP 144/89 | HR 69 | Ht 66.0 in | Wt 275.0 lb

## 2018-10-18 DIAGNOSIS — S42345D Nondisplaced spiral fracture of shaft of humerus, left arm, subsequent encounter for fracture with routine healing: Secondary | ICD-10-CM

## 2018-10-18 DIAGNOSIS — R21 Rash and other nonspecific skin eruption: Secondary | ICD-10-CM

## 2018-10-18 MED ORDER — HYDROCORTISONE 1 % EX CREA: 1.0000 "application " | TOPICAL_CREAM | Freq: Two times a day (BID) | CUTANEOUS | 0 refills | Status: DC

## 2018-10-18 MED ORDER — OXYCODONE-ACETAMINOPHEN 5-325 MG PO TABS: 1.0000 | ORAL_TABLET | Freq: Three times a day (TID) | ORAL | 0 refills | Status: DC | PRN

## 2018-10-18 NOTE — Progress Notes (Signed)
Chief Complaint  Patient presents with  . Arm Injury    humerus fracture 09/19/18     Humerus fracture treating with fracture brace patient developed a rash under the medial side of the arm  Rash is approximately 3 x 3-1/2 cm appears to be macular may be fungal may be just irritation  I prescribed a cream to put on it to see if it will control it  Continue brace wear and cover area with Band-Aid  Meds ordered this encounter  Medications  . hydrocortisone cream 1 %    Sig: Apply 1 application topically 2 (two) times daily.    Dispense:  30 g    Refill:  0  . oxyCODONE-acetaminophen (PERCOCET) 5-325 MG tablet    Sig: Take 1 tablet by mouth every 8 (eight) hours as needed for up to 7 days.    Dispense:  21 tablet    Refill:  0

## 2018-10-18 NOTE — Telephone Encounter (Addendum)
She needs to come in so I can look at it and fix it, you do not need to put on the schedule try to have her come in at about 130

## 2018-10-18 NOTE — Telephone Encounter (Signed)
Called back to patient; she has no voice mail set up. Will keep trying.

## 2018-10-18 NOTE — Telephone Encounter (Signed)
Patient called to relay that there is an area on her fracture cuff that is "rubbing her skin raw."  Asked if something topical can be applied.  Please advise if need to see today?  Patient ph# [phone]

## 2018-10-19 NOTE — Telephone Encounter (Signed)
Patient was reached on same day, 10/18/18, and came to office as noted. Completed.

## 2018-10-25 ENCOUNTER — Other Ambulatory Visit: Payer: Self-pay | Admitting: Orthopedic Surgery

## 2018-10-25 DIAGNOSIS — S42345D Nondisplaced spiral fracture of shaft of humerus, left arm, subsequent encounter for fracture with routine healing: Secondary | ICD-10-CM

## 2018-10-25 MED ORDER — OXYCODONE-ACETAMINOPHEN 5-325 MG PO TABS: 1.0000 | ORAL_TABLET | Freq: Three times a day (TID) | ORAL | 0 refills | Status: DC | PRN

## 2018-10-25 NOTE — Telephone Encounter (Signed)
Oxycodone-Acetaminophen  5/325 mg  Qty 21 Tablets  Take 1 tablet by mouth every 8 (eight) hours as needed for up to 7 days  PATIENT USES [facility] [address]

## 2018-11-01 ENCOUNTER — Ambulatory Visit (INDEPENDENT_AMBULATORY_CARE_PROVIDER_SITE_OTHER): Payer: Self-pay

## 2018-11-01 ENCOUNTER — Ambulatory Visit (INDEPENDENT_AMBULATORY_CARE_PROVIDER_SITE_OTHER): Payer: Self-pay | Admitting: Orthopedic Surgery

## 2018-11-01 ENCOUNTER — Encounter: Payer: Self-pay | Admitting: Orthopedic Surgery

## 2018-11-01 VITALS — BP 144/90 | HR 86 | Ht 66.0 in | Wt 279.0 lb

## 2018-11-01 DIAGNOSIS — S42345D Nondisplaced spiral fracture of shaft of humerus, left arm, subsequent encounter for fracture with routine healing: Secondary | ICD-10-CM

## 2018-11-01 MED ORDER — OXYCODONE-ACETAMINOPHEN 5-325 MG PO TABS: 1.0000 | ORAL_TABLET | Freq: Two times a day (BID) | ORAL | 0 refills | Status: AC | PRN

## 2018-11-01 NOTE — Progress Notes (Signed)
FRACTURE CARE   Encounter Diagnosis  Name Primary?  . Closed nondisplaced spiral fracture of shaft of left humerus with routine healing, subsequent encounter 09/19/18 Yes   6 weeks after proximal to midshaft left humerus fracture treated with fracture brace currently on oxycodone for pain  Radial nerve function today was normal  Patient's BMI is 45 patient counseled on weight loss previously The patient meets the AMA guidelines for Morbid (severe) obesity with a BMI > 40.0 and I have recommended weight loss.   X-rays show callus forming appropriately alignment looks good brace fitting appropriately  Return to work December 2  X-ray in a month  Continue opioid medication decrease  Meds ordered this encounter  Medications  . oxyCODONE-acetaminophen (PERCOCET) 5-325 MG tablet    Sig: Take 1 tablet by mouth every 12 (twelve) hours as needed for up to 7 days.    Dispense:  14 tablet    Refill:  0

## 2018-11-01 NOTE — Patient Instructions (Signed)
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

## 2018-11-06 ENCOUNTER — Ambulatory Visit: Payer: Self-pay | Admitting: Orthopedic Surgery

## 2018-11-07 ENCOUNTER — Other Ambulatory Visit: Payer: Self-pay | Admitting: Orthopedic Surgery

## 2018-11-07 DIAGNOSIS — S42345D Nondisplaced spiral fracture of shaft of humerus, left arm, subsequent encounter for fracture with routine healing: Secondary | ICD-10-CM

## 2018-11-07 MED ORDER — HYDROCODONE-ACETAMINOPHEN 7.5-325 MG PO TABS: 1.0000 | ORAL_TABLET | Freq: Three times a day (TID) | ORAL | 0 refills | Status: DC | PRN

## 2018-11-07 NOTE — Telephone Encounter (Signed)
Oxycodone-Acetaminophen  5/325 mg  Qty 14 Tablets  Take 1 tablet by mouth every 12(twelve) hours as needed for up to 7 days.  PATIENT USES [facility] [address]

## 2018-11-07 NOTE — Progress Notes (Signed)
Medication reduction  

## 2018-11-14 ENCOUNTER — Other Ambulatory Visit: Payer: Self-pay | Admitting: Orthopedic Surgery

## 2018-11-14 NOTE — Telephone Encounter (Signed)
Hydrocodone-Acetaminophen  7.5/325 mg  Qty 21 Tablets  Take 1 tablet by mouth every 8 (eight) hours as needed for up to 7 days   for moderate pain  PATIENT USES [facility] [address]

## 2018-11-15 MED ORDER — HYDROCODONE-ACETAMINOPHEN 7.5-325 MG PO TABS: 1.0000 | ORAL_TABLET | Freq: Three times a day (TID) | ORAL | 0 refills | Status: DC | PRN

## 2018-11-21 ENCOUNTER — Other Ambulatory Visit: Payer: Self-pay | Admitting: Orthopedic Surgery

## 2018-11-21 NOTE — Telephone Encounter (Signed)
Patient requested refill on Hydrocodone/Acetaminophen 7.5-325 mgs.  Qty  21  Sig: Take 1 tablet by mouth every 8 (eight) hours as needed for up to 7 days for moderate pain.  Patient states she [address] [facility] [address]

## 2018-11-22 MED ORDER — HYDROCODONE-ACETAMINOPHEN 7.5-325 MG PO TABS: 1.0000 | ORAL_TABLET | Freq: Three times a day (TID) | ORAL | 0 refills | Status: AC | PRN

## 2018-12-04 ENCOUNTER — Telehealth: Payer: Self-pay | Admitting: Orthopedic Surgery

## 2018-12-04 NOTE — Telephone Encounter (Signed)
This is Dr. Mort SawyersHarrison's patient and she is in fracture care. Dr. Romeo AppleHarrison only prescribes her a 7 day supply of pain medication. She states she has been out since Christmas Day and would like to see if you would do a refill for her.  Hydrocodone-Acetaminophen 7.5/325 mg  Qty 21 Tablets  Take 1 tablet by mouth every 8 (eight) hours as needed for up to 7 days for moderate pain.  PATIENT [facility]ES [facility] [address]

## 2018-12-05 MED ORDER — HYDROCODONE-ACETAMINOPHEN 7.5-325 MG PO TABS: 1.0000 | ORAL_TABLET | Freq: Four times a day (QID) | ORAL | 0 refills | Status: DC | PRN

## 2018-12-11 ENCOUNTER — Encounter: Payer: Self-pay | Admitting: Orthopedic Surgery

## 2018-12-11 ENCOUNTER — Ambulatory Visit (INDEPENDENT_AMBULATORY_CARE_PROVIDER_SITE_OTHER): Payer: Self-pay | Admitting: Orthopedic Surgery

## 2018-12-11 ENCOUNTER — Ambulatory Visit (INDEPENDENT_AMBULATORY_CARE_PROVIDER_SITE_OTHER): Payer: Self-pay

## 2018-12-11 VITALS — BP 151/86 | HR 65 | Ht 66.0 in | Wt 280.0 lb

## 2018-12-11 DIAGNOSIS — S42345D Nondisplaced spiral fracture of shaft of humerus, left arm, subsequent encounter for fracture with routine healing: Secondary | ICD-10-CM

## 2018-12-11 MED ORDER — HYDROCODONE-ACETAMINOPHEN 7.5-325 MG PO TABS: 1.0000 | ORAL_TABLET | Freq: Three times a day (TID) | ORAL | 0 refills | Status: AC | PRN

## 2018-12-11 NOTE — Progress Notes (Signed)
Patient ID: Kelly Steele, female   DOB: 03/28/1981, 38 y.o.   MRN: [id number]  FRACTURE CARE   Chief Complaint  Patient presents with  . Fracture    Lt humerus DOI 09/20/18    Encounter Diagnosis  Name Primary?  . Closed nondisplaced spiral fracture of shaft of left humerus with routine healing, subsequent encounter 09/19/18 Yes    POST INJURY DAY: 66  GLOBAL PERIOD DAY /90, 10/16 first day of treatment  X-ray today shows fracture is healing appropriately alignment is good with slight angulation in each plane  Patient says she feels much better we have been able to taper her medication down to every 8 hours  Her radial nerve is intact  X-ray in 6 weeks  Continue fracture cuff

## 2018-12-19 ENCOUNTER — Other Ambulatory Visit: Payer: Self-pay | Admitting: Orthopedic Surgery

## 2018-12-19 NOTE — Telephone Encounter (Signed)
Patient requests refill on Hydrocodone/Acetaminphen 7.5-325 mgs.  Qty  21  Sig: Take 1 tablet by mouth every 8 (eight) hours as needed for up to 7 days. One every six hours as needed for pain. Seven day limit  [facility] [address].

## 2018-12-20 ENCOUNTER — Other Ambulatory Visit: Payer: Self-pay | Admitting: Orthopedic Surgery

## 2018-12-20 MED ORDER — HYDROCODONE-ACETAMINOPHEN 5-325 MG PO TABS: 1.0000 | ORAL_TABLET | Freq: Four times a day (QID) | ORAL | 0 refills | Status: AC | PRN

## 2018-12-28 ENCOUNTER — Other Ambulatory Visit: Payer: Self-pay | Admitting: Orthopedic Surgery

## 2018-12-28 MED ORDER — HYDROCODONE-ACETAMINOPHEN 5-325 MG PO TABS: 1.0000 | ORAL_TABLET | Freq: Four times a day (QID) | ORAL | 0 refills | Status: DC | PRN

## 2018-12-28 NOTE — Telephone Encounter (Signed)
Patient requests refill on Hydrocodone/Acetaminophen 5-325  Mgs. Qty  28  Sig: Take 1 tablet by mouth every 6 (six) hours as needed for up to 7 days for moderate pain.  [facility] [address]

## 2019-01-03 ENCOUNTER — Other Ambulatory Visit: Payer: Self-pay | Admitting: Orthopedic Surgery

## 2019-01-03 MED ORDER — HYDROCODONE-ACETAMINOPHEN 5-325 MG PO TABS: 1.0000 | ORAL_TABLET | Freq: Four times a day (QID) | ORAL | 0 refills | Status: DC | PRN

## 2019-01-03 NOTE — Telephone Encounter (Signed)
Hydrocodone-Acetaminophen  5/325 mg  Qty 28 Tablets ° °PATIENT USES [facility] [address] °

## 2019-01-10 ENCOUNTER — Telehealth: Payer: Self-pay | Admitting: Orthopedic Surgery

## 2019-01-10 MED ORDER — HYDROCODONE-ACETAMINOPHEN 5-325 MG PO TABS: 1.0000 | ORAL_TABLET | Freq: Four times a day (QID) | ORAL | 0 refills | Status: DC | PRN

## 2019-01-10 NOTE — Telephone Encounter (Signed)
Call received from patient of Dr Mort Sawyers - aware Dr Romeo Apple is out of clinic this week, and that Dr Hilda Lias is reviewing refill requests, asking for medication refill: HYDROcodone-acetaminophen (NORCO/VICODIN) 5-325 MG tablet 28 tablet 0   -[facility], [address] [place]

## 2019-01-17 ENCOUNTER — Other Ambulatory Visit: Payer: Self-pay | Admitting: Orthopedic Surgery

## 2019-01-17 MED ORDER — HYDROCODONE-ACETAMINOPHEN 5-325 MG PO TABS: 1.0000 | ORAL_TABLET | Freq: Four times a day (QID) | ORAL | 0 refills | Status: DC | PRN

## 2019-01-17 NOTE — Telephone Encounter (Signed)
Hydrocodone-Acetaminophen  5/325 mg Qty 28 Tablets ° °Take 1 tablet by mouth every 6 (six) hours as needed for moderate pain. ° ° °PATIENT USES [facility] [address] °

## 2019-01-22 ENCOUNTER — Ambulatory Visit (INDEPENDENT_AMBULATORY_CARE_PROVIDER_SITE_OTHER): Payer: Self-pay | Admitting: Orthopedic Surgery

## 2019-01-22 ENCOUNTER — Ambulatory Visit (INDEPENDENT_AMBULATORY_CARE_PROVIDER_SITE_OTHER): Payer: Self-pay

## 2019-01-22 ENCOUNTER — Encounter: Payer: Self-pay | Admitting: Orthopedic Surgery

## 2019-01-22 VITALS — BP 148/89 | HR 80 | Ht 66.0 in | Wt 280.0 lb

## 2019-01-22 DIAGNOSIS — S42345D Nondisplaced spiral fracture of shaft of humerus, left arm, subsequent encounter for fracture with routine healing: Secondary | ICD-10-CM

## 2019-01-22 MED ORDER — HYDROCODONE-ACETAMINOPHEN 5-325 MG PO TABS: 1.0000 | ORAL_TABLET | Freq: Four times a day (QID) | ORAL | 0 refills | Status: DC | PRN

## 2019-01-22 NOTE — Progress Notes (Signed)
Chief Complaint  Patient presents with  . Arm Injury    09/19/18 left humerus fracture     38 years old left comminuted fracture of the humerus treated with a fracture brace healing appropriately complains of weakness  Some tenderness at the prominent portion of the proximal fragment x-ray looks good x-ray again in 2 months refill pain medicine  Encounter Diagnosis  Name Primary?  . Closed nondisplaced spiral fracture of shaft of left humerus with routine healing, subsequent encounter 09/19/18 Yes

## 2019-01-29 ENCOUNTER — Other Ambulatory Visit: Payer: Self-pay | Admitting: Orthopedic Surgery

## 2019-01-29 DIAGNOSIS — S42345D Nondisplaced spiral fracture of shaft of humerus, left arm, subsequent encounter for fracture with routine healing: Secondary | ICD-10-CM

## 2019-01-29 MED ORDER — HYDROCODONE-ACETAMINOPHEN 5-325 MG PO TABS: 1.0000 | ORAL_TABLET | Freq: Four times a day (QID) | ORAL | 0 refills | Status: DC | PRN

## 2019-01-29 NOTE — Telephone Encounter (Signed)
Patient called for refill: °HYDROcodone-acetaminophen (NORCO/VICODIN) 5-325 MG tablet 28 tablet  °- [facility], [address], [place] ° °

## 2019-02-07 ENCOUNTER — Other Ambulatory Visit: Payer: Self-pay | Admitting: Orthopedic Surgery

## 2019-02-07 DIAGNOSIS — S42345D Nondisplaced spiral fracture of shaft of humerus, left arm, subsequent encounter for fracture with routine healing: Secondary | ICD-10-CM

## 2019-02-07 MED ORDER — HYDROCODONE-ACETAMINOPHEN 5-325 MG PO TABS: 1.0000 | ORAL_TABLET | Freq: Four times a day (QID) | ORAL | 0 refills | Status: DC | PRN

## 2019-02-07 NOTE — Telephone Encounter (Signed)
Patient requests refill on Hydrocodone/Acetaminophen 5-325  Mgs.  Qty  28  Sig: Take 1 tablet by mouth every 6 (six) hours as needed for moderate pain.  Patient states she uses [facility] on [address]

## 2019-02-13 ENCOUNTER — Other Ambulatory Visit: Payer: Self-pay | Admitting: Orthopedic Surgery

## 2019-02-13 DIAGNOSIS — S42345D Nondisplaced spiral fracture of shaft of humerus, left arm, subsequent encounter for fracture with routine healing: Secondary | ICD-10-CM

## 2019-02-13 MED ORDER — HYDROCODONE-ACETAMINOPHEN 5-325 MG PO TABS: 1.0000 | ORAL_TABLET | Freq: Four times a day (QID) | ORAL | 0 refills | Status: DC | PRN

## 2019-02-13 NOTE — Telephone Encounter (Signed)
Patient requests refill on Hydrocodone/Acetaminophen 5-325  Mgs.  Qty  28 ° °Sig: Take 1 tablet by mouth every 6 (six) hours as needed for moderate pain. ° °Patient states she uses [facility] on [address] °

## 2019-02-16 ENCOUNTER — Encounter: Payer: Self-pay | Admitting: Orthopedic Surgery

## 2019-02-20 ENCOUNTER — Other Ambulatory Visit: Payer: Self-pay | Admitting: Orthopedic Surgery

## 2019-02-20 DIAGNOSIS — S42345D Nondisplaced spiral fracture of shaft of humerus, left arm, subsequent encounter for fracture with routine healing: Secondary | ICD-10-CM

## 2019-02-20 NOTE — Telephone Encounter (Signed)
Patient requests refill on Hydrocodone/Acetaminophen 5-325  Mgs.  Qty  28 ° °Sig: Take 1 tablet by mouth every 6 (six) hours as needed for moderate pain. ° °Patient states she uses [facility] on [address] °

## 2019-02-21 MED ORDER — HYDROCODONE-ACETAMINOPHEN 5-325 MG PO TABS: 1.0000 | ORAL_TABLET | Freq: Three times a day (TID) | ORAL | 0 refills | Status: DC | PRN

## 2019-02-26 ENCOUNTER — Other Ambulatory Visit: Payer: Self-pay | Admitting: Orthopedic Surgery

## 2019-02-26 DIAGNOSIS — S42345D Nondisplaced spiral fracture of shaft of humerus, left arm, subsequent encounter for fracture with routine healing: Secondary | ICD-10-CM

## 2019-02-26 MED ORDER — HYDROCODONE-ACETAMINOPHEN 5-325 MG PO TABS: 1.0000 | ORAL_TABLET | Freq: Three times a day (TID) | ORAL | 0 refills | Status: AC | PRN

## 2019-02-26 NOTE — Telephone Encounter (Signed)
Patient requests refill on Hydrocodone/Acetaminpohen 5-325  Mgs.  Qty  15  Sig: Take 1 tablet by mouth every 8 (eight) hours as needed for up to 5 days for moderate pain.  Patient states she uses [facility] on [address]

## 2019-03-08 ENCOUNTER — Other Ambulatory Visit: Payer: Self-pay | Admitting: Orthopedic Surgery

## 2019-03-08 NOTE — Telephone Encounter (Signed)
Hydrocodone-Acetaminophen  5/325 mg  Qty 15 Tablets   Take 1 tablet by mouth every 8 (eight) hours as needed for up to 5 days for moderate pain.  PATIENT USES [facility] [address]

## 2019-03-09 MED ORDER — HYDROCODONE-ACETAMINOPHEN 5-325 MG PO TABS: 1.0000 | ORAL_TABLET | Freq: Three times a day (TID) | ORAL | 0 refills | Status: DC | PRN

## 2019-03-14 ENCOUNTER — Other Ambulatory Visit: Payer: Self-pay | Admitting: Orthopedic Surgery

## 2019-03-14 MED ORDER — HYDROCODONE-ACETAMINOPHEN 5-325 MG PO TABS: 1.0000 | ORAL_TABLET | Freq: Three times a day (TID) | ORAL | 0 refills | Status: DC | PRN

## 2019-03-14 NOTE — Telephone Encounter (Signed)
Hydrocodone-Acetaminophen  5/325 mg  Qty 15 Tablets  Take 1 tablet by mouth every 8 (eight) hours as needed for moderate pain.  PATIENT USES [facility] [address]

## 2019-03-21 ENCOUNTER — Ambulatory Visit (INDEPENDENT_AMBULATORY_CARE_PROVIDER_SITE_OTHER): Payer: Self-pay

## 2019-03-21 ENCOUNTER — Other Ambulatory Visit: Payer: Self-pay

## 2019-03-21 ENCOUNTER — Encounter: Payer: Self-pay | Admitting: Orthopedic Surgery

## 2019-03-21 ENCOUNTER — Ambulatory Visit (INDEPENDENT_AMBULATORY_CARE_PROVIDER_SITE_OTHER): Payer: Self-pay | Admitting: Orthopedic Surgery

## 2019-03-21 VITALS — BP 147/101 | HR 68 | Ht 66.0 in | Wt 280.0 lb

## 2019-03-21 DIAGNOSIS — S42345D Nondisplaced spiral fracture of shaft of humerus, left arm, subsequent encounter for fracture with routine healing: Secondary | ICD-10-CM

## 2019-03-21 MED ORDER — HYDROCODONE-ACETAMINOPHEN 5-325 MG PO TABS: 1.0000 | ORAL_TABLET | Freq: Three times a day (TID) | ORAL | 0 refills | Status: AC | PRN

## 2019-03-21 NOTE — Progress Notes (Signed)
Progress Note   Patient ID: Kelly Steele, female   DOB: 23-Jan-1981, 38 y.o.   MRN: [id number]   Chief Complaint  Patient presents with  . Arm Injury    09/19/18 left humerus fracture has had increased pain at night for a couple days     38 year old left humerus fracture treated with a fracture cuff and nonoperative treatment  Developed some angulation at the fracture site but overall has progressed but slowly.  Complains of some pain over the biceps on the left side      Review of Systems  Neurological: Positive for focal weakness. Negative for tingling.     Allergies  Allergen Reactions  . Penicillins Other (See Comments)    Has patient had a PCN reaction causing immediate rash, facial/tongue/throat swelling, SOB or lightheadedness with hypotension: Unknown Has patient had a PCN reaction causing severe rash involving mucus membranes or skin necrosis: Unknown Has patient had a PCN reaction that required hospitalization: Unknown Has patient had a PCN reaction occurring within the last 10 years: No If all of the above answers are "NO", then may proceed with Cephalosporin use.      BP (!) 147/101   Pulse 68   Ht 5\' 6"  (1.676 m)   Wt 280 lb (127 kg)   BMI 45.19 kg/m   Physical Exam Vitals signs and nursing note reviewed.  Constitutional:      Appearance: Normal appearance.  Musculoskeletal:       Arms:  Neurological:     Mental Status: She is alert and oriented to person, place, and time.  Psychiatric:        Mood and Affect: Mood normal.      Medical decisions:   Data  Imaging:   Office images today show angulation at the fracture site with bridging callus on the medial lateral aspect  Encounter Diagnosis  Name Primary?  . Closed nondisplaced spiral fracture of shaft of left humerus with routine healing, subsequent encounter 09/19/18 Yes    PLAN:   Shoulder exercises  Follow-up in 3 months  And topical medication for biceps pain  Opioid  prescription warnings  Meds ordered this encounter  Medications  . HYDROcodone-acetaminophen (NORCO/VICODIN) 5-325 MG tablet    Sig: Take 1 tablet by mouth every 8 (eight) hours as needed for moderate pain.    Dispense:  15 tablet    Refill:  0       Fuller Canada, MD 03/21/2019 11:29 AM

## 2019-03-21 NOTE — Patient Instructions (Addendum)
YOU HAVE A PRESCRIPTION FOR HYDROCODONE. THE FRACTURE HAS HEALED. THIS WILL BE THE LAST PRESCRIPTION OF OPIOID MEDICATION.  Apply biofreeze or maxfreeze ([facility] has it) 3 times a day   What You Need to Know About Prescription Opioid Pain Medicine        Please be advised. You are on a medication which is classified as an "opiod". The CDC the [facility]  has recently advised all providers to advise patient's that these medications have certain risks which include but are not limited to:    drug intolerance  drug addiction  respiratory depression   respiratory failure  Death  Please keep these medications locked away. If you feel that you are becoming addicted to these medicines or you are having difficulties with these medications please alert your provider.   As your provider I will attempt to wean you off of these medications when you're severe acute pain has been taking care of. However, if we cannot wean you off of this medication you will be sent to a pain management center where they can better manage chronic pain   Opioids are powerful medicines that are used to treat moderate to severe pain. Opioids should be taken with the supervision of a trained health care provider. They should be taken for the shortest period of time as possible. This is because opioids can be addictive and the longer you take opioids, the greater your risk of addiction (opioid use disorder). What do opioids do? Opioids help reduce or eliminate pain. When used for short periods of time, they can help you:  Sleep better.  Do better in physical or occupational therapy.  Feel better in the first few days after an injury.  Recover from surgery. What kind of problems can opioids cause? Opioids can cause side effects, such as:  Constipation.  Nausea.  Vomiting.  Drowsiness.  Confusion.  Opioid use disorder.  Breathing difficulties (respiratory depression). Using opioid  pain medicines for longer than 3 days increases your risk of these side effects. Taking opioid pain medicine for a long period of time can affect your ability to do daily tasks. It also puts you at risk for:  Car accidents.  Heart attack.  Overdose, which can sometimes lead to death. What can increase my risk for developing problems while taking opioids? You may be at an especially high risk for problems while taking opioids if you:  Are over the age of 72.  Are pregnant.  Have kidney or liver disease.  Have certain mental health conditions, such as depression or anxiety.  Have a history of substance use disorder.  Have had an opioid overdose in the past. How do I stop taking opioids if I have been taking them for a long time? If you have been taking opioid medicine for more than a few weeks, you may need to slowly stop taking them (taper). Tapering your use of opioids can decrease your chances of experiencing withdrawal symptoms, such as:  Abdominal pain and cramping.  Nausea.  Sweating.  Sleepiness.  Restlessness.  Uncontrollable shaking (tremors).  Cravings for the medicine. Do not attempt to taper your use of opioids on your own. Talk with your health care provider about how to do this. Your health care provider may prescribe a step-down schedule based on how much medicine you are taking and how long you have been taking it. What are the benefits of stopping the use of opioids? By switching from opioid pain medicine to non-opioid  pain management options, you will decrease your risk of accidents and injuries associated with long-term opioid use. You will also be able to:  Monitor your pain more accurately and know when to seek medical care if it is not improving.  Decrease risk to others around you. Having opioids in the home increases the risk for accidental or intentional use or overdose by others. How can I treat pain without opioids? Pain can be managed with many  types of alternative treatments. Ask your health care provider to refer you to one or more specialists who can help you manage pain through:  Physical or occupational therapy.  Counseling (cognitive-behavioral therapy).  Good nutrition.  Biofeedback.  Massage.  Meditation.  Non-opioid medicine.  Following a gentle exercise program. Where can I get support? If you have been taking opioids for a long time, you may benefit from receiving support for quitting from a local support group or counselor. Ask your health care provider for a referral to these resources in your area. When should I seek medical care? Seek medical care right away if you are taking opioids and you experience any of the following:  Difficulty breathing.  Breathing that is more shallow or slower than normal.  A very slow heartbeat (pulse).  Severe confusion.  Unconsciousness.  Sleepiness.  Difficulty waking from sleep.  Slurred speech.  Nausea and vomiting.  Cold, clammy skin.  Blue lips or fingernails.  Limpness.  Abnormally small pupils. If you think that you or someone else may have taken too much of an opioid medicine, get medical help right away. Do not wait to see if the symptoms go away on their own. Call your local emergency services (911 in the [place] or call the hotline of the [facility] 320-456-1543(219-489-4475 in the [place]  Where can I get more information? To learn more about opioid medicines, visit the Centers for Disease Control and Prevention web site Opioid Basics at [employer].pthttps://www.cdc.gov/drugoverdos/opioids/index.html. Summary  Opioid medicines can help you manage moderate-to-severe pain for a short period of time.  Taking opioid pain medicine for a long period of time puts you at risk for unintentional accidents, injury, and even death.  If you think that you or someone else may have taken too much of an opioid, get medical help right away. This information is not  intended to replace advice given to you by your health care provider. Make sure you discuss any questions you have with your health care provider. Document Released: 12/19/2015 Document Revised: 07/16/2016 Document Reviewed: 07/04/2015 Elsevier Interactive Patient Education  2017 [employer]

## 2019-03-23 ENCOUNTER — Ambulatory Visit: Payer: Self-pay | Admitting: Orthopedic Surgery

## 2019-04-01 ENCOUNTER — Encounter: Payer: Self-pay | Admitting: Orthopedic Surgery

## 2019-06-20 ENCOUNTER — Encounter: Payer: Self-pay | Admitting: Orthopedic Surgery

## 2019-06-20 ENCOUNTER — Ambulatory Visit: Payer: Self-pay | Admitting: Orthopedic Surgery

## 2020-08-31 IMAGING — DX DG SHOULDER 2+V*L*
2 series · 2 of 2 positions shown · non-contrast
Comparison: None.

CLINICAL DATA: Pain after fall

EXAM:
LEFT SHOULDER - 2+ VIEW

[shoulder grashey]
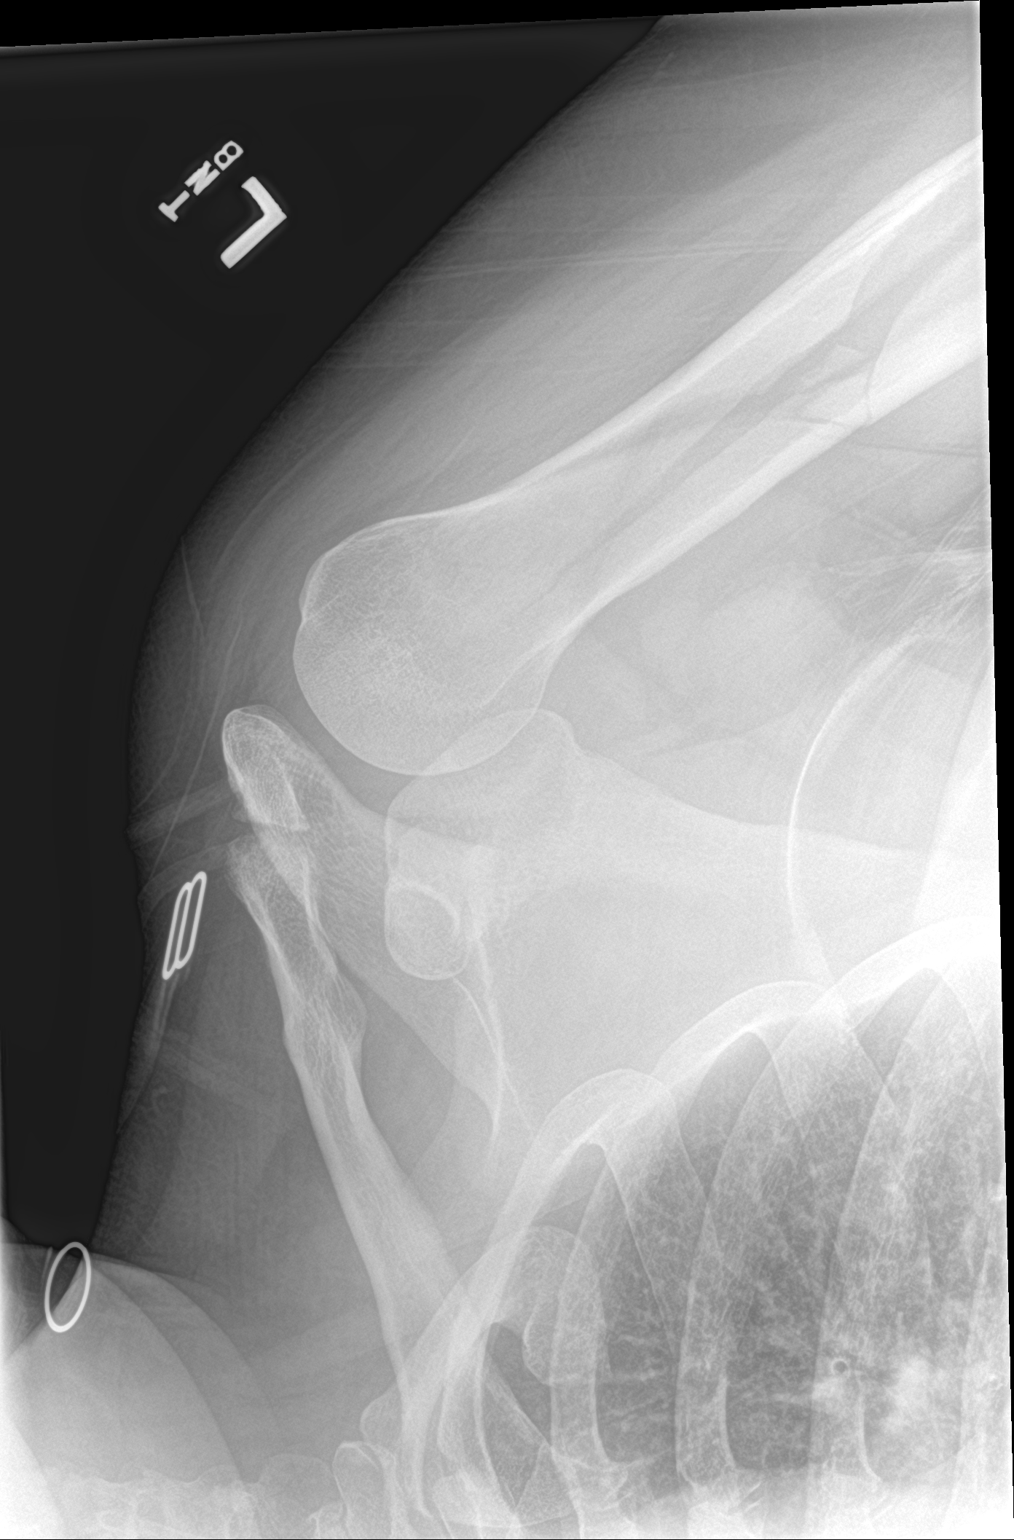

[shoulder y view]
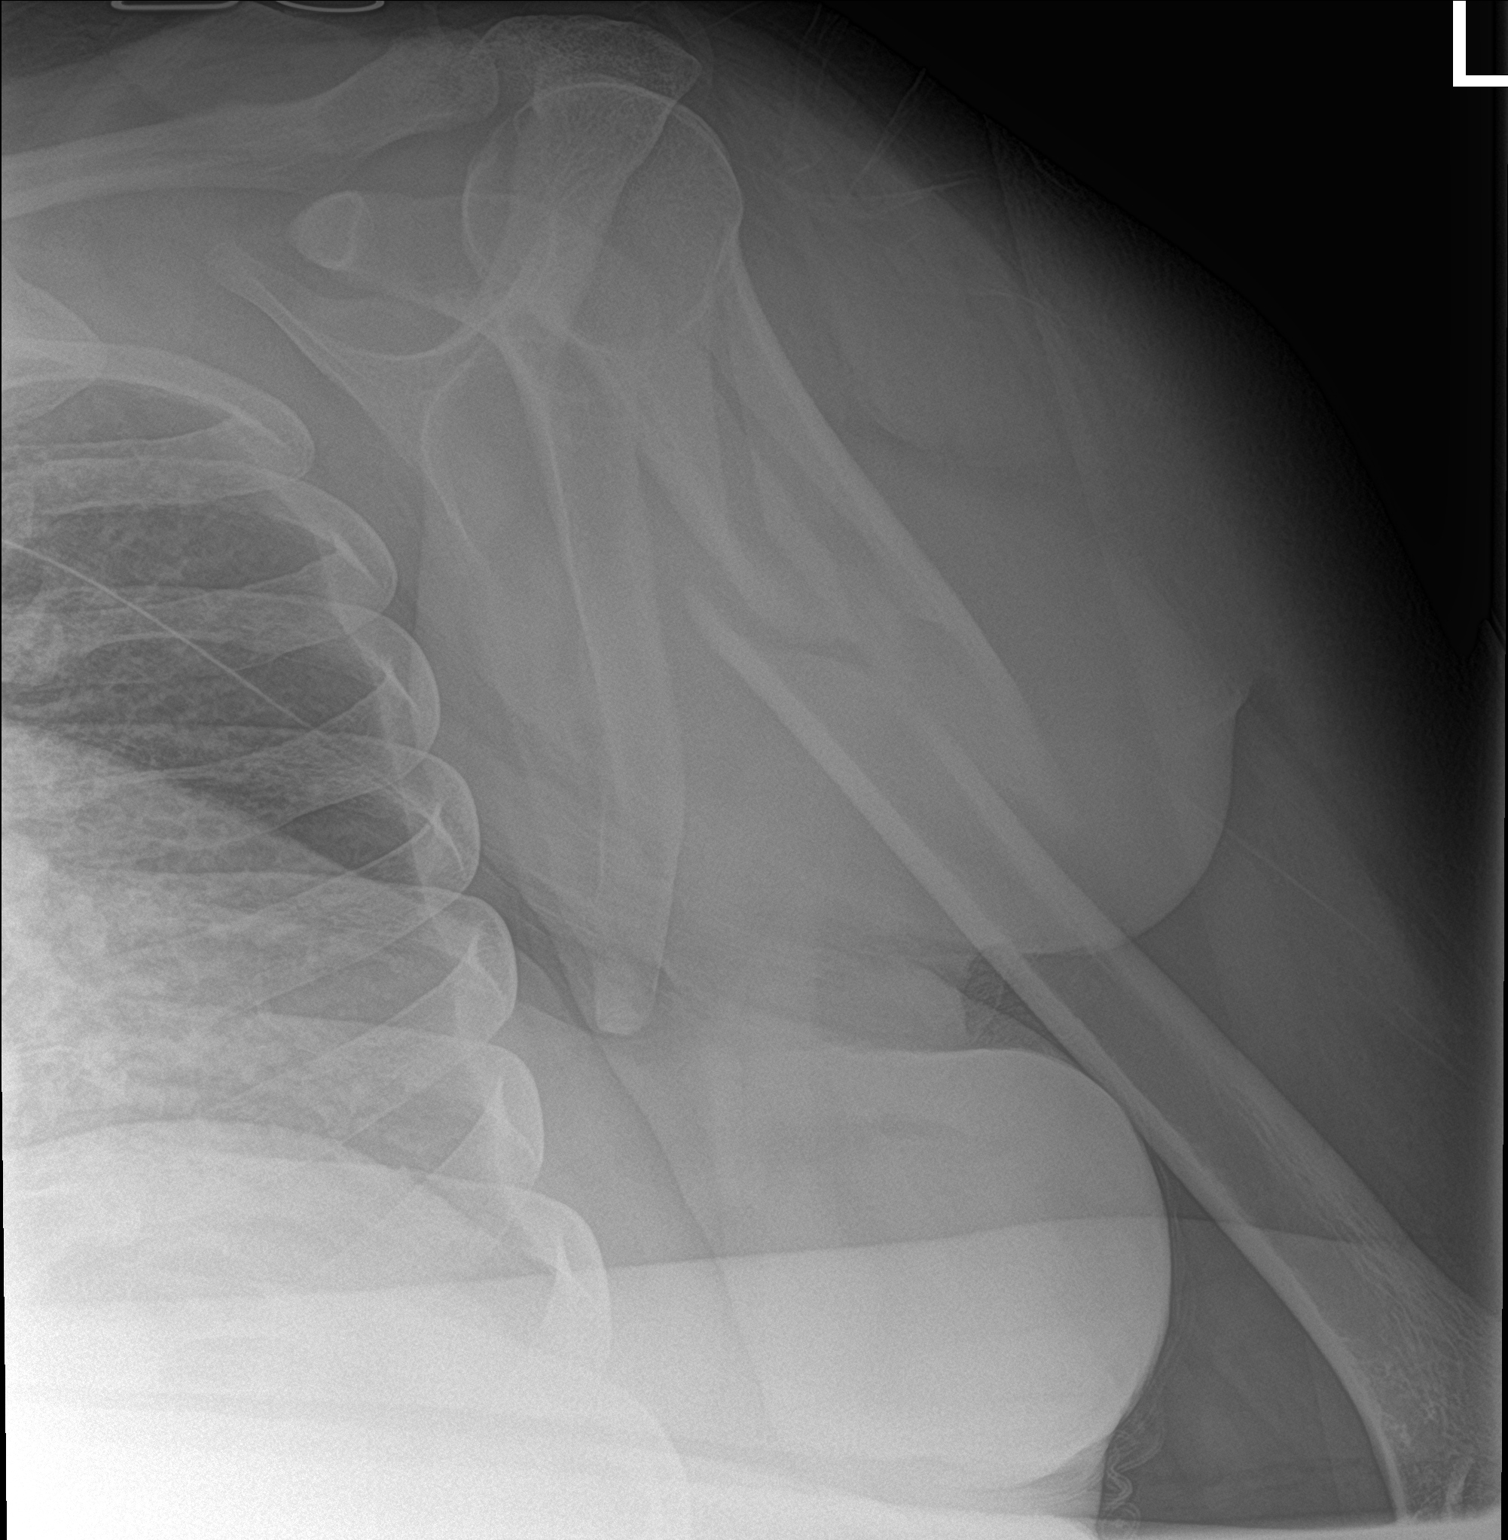

[2 of 2 positions shown; findings below may reference images not displayed]

FINDINGS: Comminuted fracture through the proximal humerus extending into the
neck. The apparent extension into the head on the dedicated humeral
films are more subtle on this film. No other fractures. The
transscapular Y-view is limited but there is no obvious dislocation.
IMPRESSION: 1. Proximal humeral fracture extending into the humeral head and
neck as above. The transscapular Y-view is limited due to
positioning but no obvious dislocation is noted.

## 2022-11-29 ENCOUNTER — Emergency Department (HOSPITAL_COMMUNITY)
Admission: EM | Admit: 2022-11-29 | Discharge: 2022-11-29 | Disposition: A | Discharge status: Home / Self Care | Payer: Self-pay | Attending: Emergency Medicine | Admitting: Emergency Medicine

## 2022-11-29 ENCOUNTER — Other Ambulatory Visit: Payer: Self-pay

## 2022-11-29 ENCOUNTER — Encounter (HOSPITAL_COMMUNITY): Payer: Self-pay

## 2022-11-29 DIAGNOSIS — J45909 Unspecified asthma, uncomplicated: Secondary | ICD-10-CM | POA: Insufficient documentation

## 2022-11-29 DIAGNOSIS — Z1152 Encounter for screening for COVID-19: Secondary | ICD-10-CM | POA: Insufficient documentation

## 2022-11-29 DIAGNOSIS — J101 Influenza due to other identified influenza virus with other respiratory manifestations: Secondary | ICD-10-CM | POA: Insufficient documentation

## 2022-11-29 LAB — RESP PANEL BY RT-PCR (RSV, FLU A&B, COVID)  RVPGX2
Influenza A by PCR: POSITIVE — AB
Influenza B by PCR: NEGATIVE
Resp Syncytial Virus by PCR: NEGATIVE
SARS Coronavirus 2 by RT PCR: NEGATIVE

## 2022-11-29 NOTE — ED Provider Notes (Signed)
[facility] EMERGENCY DEPARTMENT Provider Note   CSN: [id number] Arrival date & time: 11/29/22  1722     History  Chief Complaint  Patient presents with   Fever    Kelly Steele is a 41 y.o. female.  Patient presents the emergency room complaining of fever, nasal congestion, and cough for the past 2 days.  She states she is here mainly for a work note to allow her to go back to work.  Past medical history significant for asthma  HPI     Home Medications Prior to Admission medications   Medication Sig Start Date End Date Taking? Authorizing Provider  HYDROcodone-acetaminophen (NORCO/VICODIN) 5-325 MG tablet Take 1 tablet by mouth every 8 (eight) hours as needed for moderate pain. 03/21/19   Vickki Hearing, MD  ibuprofen (ADVIL,MOTRIN) 600 MG tablet Take 1 tablet (600 mg total) by mouth 4 (four) times daily. 04/27/18   Ivery Quale, PA-C      Allergies    Penicillins    Review of Systems   Review of Systems  Constitutional:  Positive for fever.  HENT:  Positive for congestion.   Respiratory:  Positive for cough.     Physical Exam Updated Vital Signs BP (!) 169/116 (BP Location: Right Arm)   Pulse 84   Temp 98.1 F (36.7 C) (Oral)   Resp 20   Ht 5\' 6"  (1.676 m)   Wt 122.5 kg   LMP 11/17/2022   SpO2 100%   BMI 43.58 kg/m  Physical Exam HENT:     Head: Normocephalic and atraumatic.     Nose: Congestion present.     Mouth/Throat:     Mouth: Mucous membranes are moist.  Eyes:     Conjunctiva/sclera: Conjunctivae normal.  Cardiovascular:     Rate and Rhythm: Normal rate.  Pulmonary:     Effort: Pulmonary effort is normal. No respiratory distress.     Breath sounds: Normal breath sounds.  Musculoskeletal:        General: No signs of injury.     Cervical back: Normal range of motion.  Skin:    General: Skin is dry.  Neurological:     Mental Status: She is alert.  Psychiatric:        Speech: Speech normal.        Behavior: Behavior normal.     ED  Results / Procedures / Treatments   Labs (all labs ordered are listed, but only abnormal results are displayed) Labs Reviewed  RESP PANEL BY RT-PCR (RSV, FLU A&B, COVID)  RVPGX2 - Abnormal; Notable for the following components:      Result Value   Influenza A by PCR POSITIVE (*)    All other components within normal limits    EKG None  Radiology No results found.  Procedures Procedures    Medications Ordered in ED Medications - No data to display  ED Course/ Medical Decision Making/ A&P                           Medical Decision Making  Patient presents with chief complaint of fever, congestion, and cough.  Differential includes influenza, COVID, RSV, and others  I ordered and reviewed labs.  Patient tested positive for influenza A.  This is consistent with her history and physical.  I discussed Tamiflu with the patient but she is uninterested.  I discussed supportive care at home including acetaminophen and ibuprofen usage.  Work note provided at  patient's request        Final Clinical Impression(s) / ED Diagnoses Final diagnoses:  Influenza A    Rx / DC Orders ED Discharge Orders     None         Pamala Duffel 11/29/22 Allyn Kenner, MD 12/02/22 [phone]

## 2022-11-29 NOTE — ED Triage Notes (Signed)
Pt presents with fever, nasal congestion, cough x 2 days. Pt states she is here because she needs a Dr. note to go back to work.

## 2022-11-29 NOTE — Discharge Instructions (Signed)
You were diagnosed today with the flu.  Please be sure to get plenty of fluids and rest as you are able.  You may take Tylenol or ibuprofen as needed for pain and fever control.  Do not exceed 4000 mg of acetaminophen from all dosage in a 24-hour period.  If you develop any life-threatening symptoms please return to the emergency department

## 2024-05-06 ENCOUNTER — Emergency Department (HOSPITAL_COMMUNITY)
Admission: EM | Admit: 2024-05-06 | Discharge: 2024-05-06 | Disposition: A | Discharge status: Home / Self Care | Attending: Emergency Medicine | Admitting: Emergency Medicine

## 2024-05-06 ENCOUNTER — Encounter (HOSPITAL_COMMUNITY): Payer: Self-pay | Admitting: Emergency Medicine

## 2024-05-06 ENCOUNTER — Emergency Department (HOSPITAL_COMMUNITY)

## 2024-05-06 ENCOUNTER — Other Ambulatory Visit: Payer: Self-pay

## 2024-05-06 DIAGNOSIS — R0602 Shortness of breath: Secondary | ICD-10-CM | POA: Diagnosis present

## 2024-05-06 DIAGNOSIS — J40 Bronchitis, not specified as acute or chronic: Secondary | ICD-10-CM | POA: Insufficient documentation

## 2024-05-06 MED ORDER — ALBUTEROL SULFATE HFA 108 (90 BASE) MCG/ACT IN AERS
2.0000 | INHALATION_SPRAY | RESPIRATORY_TRACT | Status: DC | PRN
  Filled 2024-05-06: qty 6.7

## 2024-05-06 MED ORDER — PREDNISONE 50 MG PO TABS
60.0000 mg | ORAL_TABLET | Freq: Once | ORAL | Status: AC
  Administered 2024-05-06: 60 mg via ORAL
  Filled 2024-05-06: qty 1

## 2024-05-06 MED ORDER — IPRATROPIUM-ALBUTEROL 0.5-2.5 (3) MG/3ML IN SOLN
3.0000 mL | Freq: Once | RESPIRATORY_TRACT | Status: AC
  Administered 2024-05-06: 3 mL via RESPIRATORY_TRACT
  Filled 2024-05-06: qty 3

## 2024-05-06 MED ORDER — ALBUTEROL SULFATE (2.5 MG/3ML) 0.083% IN NEBU
2.5000 mg | INHALATION_SOLUTION | Freq: Once | RESPIRATORY_TRACT | Status: AC
  Administered 2024-05-06: 2.5 mg via RESPIRATORY_TRACT
  Filled 2024-05-06: qty 3

## 2024-05-06 MED ORDER — PREDNISONE 20 MG PO TABS: 40.0000 mg | ORAL_TABLET | Freq: Every day | ORAL | 0 refills | Status: AC

## 2024-05-06 NOTE — ED Provider Notes (Signed)
 [place] EMERGENCY DEPARTMENT AT [facility] Provider Note   CSN: [id number] Arrival date & time: 05/06/24  0544     History  Chief Complaint  Patient presents with   Shortness of Breath    Kelly Steele is a 43 y.o. female.  Patient presents to the emergency department for evaluation of difficulty breathing.  Patient reports that she has not cough.  Symptoms started 4 days ago and have progressively worsened.  She was given a nebulizer treatment by a friend yesterday but it did not help.  No history of asthma or COPD.       Home Medications Prior to Admission medications   Medication Sig Start Date End Date Taking? Authorizing Provider  HYDROcodone -acetaminophen  (NORCO/VICODIN) 5-325 MG tablet Take 1 tablet by mouth every 8 (eight) hours as needed for moderate pain. 03/21/19   Darrin Emerald, MD  ibuprofen  (ADVIL ,MOTRIN ) 600 MG tablet Take 1 tablet (600 mg total) by mouth 4 (four) times daily. 04/27/18   Venson Ginger, PA-C      Allergies    Penicillins    Review of Systems   Review of Systems  Physical Exam Updated Vital Signs BP (!) 143/101 (BP Location: Left Arm)   Pulse 79   Temp 98.2 F (36.8 C) (Oral)   Resp (!) 21   Ht 5\' 6"  (1.676 m)   Wt 120.7 kg   LMP 04/24/2024 (Approximate)   SpO2 98%   BMI 42.97 kg/m  Physical Exam Vitals and nursing note reviewed.  Constitutional:      General: She is not in acute distress.    Appearance: She is well-developed.  HENT:     Head: Normocephalic and atraumatic.     Mouth/Throat:     Mouth: Mucous membranes are moist.  Eyes:     General: Vision grossly intact. Gaze aligned appropriately.     Extraocular Movements: Extraocular movements intact.     Conjunctiva/sclera: Conjunctivae normal.  Cardiovascular:     Rate and Rhythm: Normal rate and regular rhythm.     Pulses: Normal pulses.     Heart sounds: Normal heart sounds, S1 normal and S2 normal. No murmur heard.    No friction rub. No gallop.   Pulmonary:     Effort: Pulmonary effort is normal. No respiratory distress.     Breath sounds: Decreased breath sounds and wheezing present.  Abdominal:     General: Bowel sounds are normal.     Palpations: Abdomen is soft.     Tenderness: There is no abdominal tenderness. There is no guarding or rebound.     Hernia: No hernia is present.  Musculoskeletal:        General: No swelling.     Cervical back: Full passive range of motion without pain, normal range of motion and neck supple. No spinous process tenderness or muscular tenderness. Normal range of motion.     Right lower leg: No edema.     Left lower leg: No edema.  Skin:    General: Skin is warm and dry.     Capillary Refill: Capillary refill takes less than 2 seconds.     Findings: No ecchymosis, erythema, rash or wound.  Neurological:     General: No focal deficit present.     Mental Status: She is alert and oriented to person, place, and time.     GCS: GCS eye subscore is 4. GCS verbal subscore is 5. GCS motor subscore is 6.     Cranial  Nerves: Cranial nerves 2-12 are intact.     Sensory: Sensation is intact.     Motor: Motor function is intact.     Coordination: Coordination is intact.  Psychiatric:        Attention and Perception: Attention normal.        Mood and Affect: Mood normal.        Speech: Speech normal.        Behavior: Behavior normal.     ED Results / Procedures / Treatments   Labs (all labs ordered are listed, but only abnormal results are displayed) Labs Reviewed  BASIC METABOLIC PANEL WITH GFR  CBC    EKG EKG Interpretation Date/Time:  Sunday May 06 2024 06:18:36 EDT Ventricular Rate:  79 PR Interval:  138 QRS Duration:  84 QT Interval:  398 QTC Calculation: 457 R Axis:   82  Text Interpretation: Sinus rhythm Normal ECG Confirmed by Ballard Bongo [phone]) on 05/06/2024 6:22:02 AM  Radiology No results found.  Procedures Procedures    Medications Ordered in  ED Medications  predniSONE (DELTASONE) tablet 60 mg (has no administration in time range)  ipratropium-albuterol (DUONEB) 0.5-2.5 (3) MG/3ML nebulizer solution 3 mL (has no administration in time range)  albuterol (PROVENTIL) (2.5 MG/3ML) 0.083% nebulizer solution 2.5 mg (has no administration in time range)    ED Course/ Medical Decision Making/ A&P                                 Medical Decision Making Amount and/or Complexity of Data Reviewed Labs: ordered. Radiology: ordered.   Differential Diagnosis considered includes, but not limited to: COVID-19; influenza; RSV; simple viral URI; strep pharyngitis; pneumonia  Presents to the emergency department for evaluation of cough and difficulty breathing.  Patient with wheezing throughout all lung fields but normal oxygen saturations in no distress.  Will be given a breathing treatment, started on steroids.  X-ray to rule out pneumonia.        Final Clinical Impression(s) / ED Diagnoses Final diagnoses:  Bronchitis    Rx / DC Orders ED Discharge Orders     None         Catalea Labrecque, Marine Sia, MD 05/06/24 [phone]

## 2024-05-06 NOTE — ED Triage Notes (Signed)
 POV to ed c/o of SOB that started yesterday. A friend gave pt a breathing tx yesterday with no improvement. S/sx started 4xdays ago. Pt 98% RA during triage. Pt is congested with audible wheezing after walking to room. Pt has a non-productive cough.
